# Patient Record
Sex: Female | Born: 1945 | Race: White | Hispanic: No | Marital: Married | Smoking: Current every day smoker
Health system: Southern US, Community
[De-identification: ages and names within clinical notes are randomized; demographics above are authoritative.]

## PROBLEM LIST (undated history)

## (undated) DIAGNOSIS — M1990 Unspecified osteoarthritis, unspecified site: Secondary | ICD-10-CM

## (undated) DIAGNOSIS — I10 Essential (primary) hypertension: Secondary | ICD-10-CM

## (undated) DIAGNOSIS — E079 Disorder of thyroid, unspecified: Secondary | ICD-10-CM

## (undated) DIAGNOSIS — A0472 Enterocolitis due to Clostridium difficile, not specified as recurrent: Secondary | ICD-10-CM

## (undated) HISTORY — PX: TONSILLECTOMY: SUR1361

## (undated) SURGERY — PULMONARY THROMBECTOMY
Anesthesia: Moderate Sedation | Laterality: Bilateral

---

## 2003-09-20 ENCOUNTER — Encounter: Admission: RE | Admit: 2003-09-20 | Discharge: 2003-09-20 | Payer: Self-pay | Admitting: Cardiology

## 2013-07-16 ENCOUNTER — Inpatient Hospital Stay: Payer: Self-pay | Admitting: Internal Medicine

## 2013-07-16 LAB — URINALYSIS, COMPLETE
Bilirubin,UR: NEGATIVE
Blood: NEGATIVE
Nitrite: NEGATIVE
Ph: 5 (ref 4.5–8.0)
RBC,UR: 1 /HPF (ref 0–5)
Specific Gravity: 1.025 (ref 1.003–1.030)
Squamous Epithelial: 3
WBC UR: 3 /HPF (ref 0–5)

## 2013-07-16 LAB — COMPREHENSIVE METABOLIC PANEL
Alkaline Phosphatase: 75 U/L (ref 50–136)
Calcium, Total: 8.9 mg/dL (ref 8.5–10.1)
Chloride: 111 mmol/L — ABNORMAL HIGH (ref 98–107)
Co2: 24 mmol/L (ref 21–32)
Creatinine: 1.2 mg/dL (ref 0.60–1.30)
EGFR (African American): 55 — ABNORMAL LOW
Potassium: 3.2 mmol/L — ABNORMAL LOW (ref 3.5–5.1)
SGOT(AST): 16 U/L (ref 15–37)
SGPT (ALT): 25 U/L (ref 12–78)
Sodium: 143 mmol/L (ref 136–145)

## 2013-07-16 LAB — CBC
HCT: 43.2 % (ref 35.0–47.0)
HGB: 14.9 g/dL (ref 12.0–16.0)
MCH: 31.7 pg (ref 26.0–34.0)
MCHC: 34.6 g/dL (ref 32.0–36.0)
MCV: 92 fL (ref 80–100)
RBC: 4.71 10*6/uL (ref 3.80–5.20)
RDW: 13.2 % (ref 11.5–14.5)
WBC: 9.6 10*3/uL (ref 3.6–11.0)

## 2013-07-16 LAB — LIPASE, BLOOD: Lipase: 67 U/L — ABNORMAL LOW (ref 73–393)

## 2013-07-17 LAB — COMPREHENSIVE METABOLIC PANEL
Alkaline Phosphatase: 55 U/L (ref 50–136)
Anion Gap: 5 — ABNORMAL LOW (ref 7–16)
Bilirubin,Total: 0.3 mg/dL (ref 0.2–1.0)
Calcium, Total: 8 mg/dL — ABNORMAL LOW (ref 8.5–10.1)
Chloride: 116 mmol/L — ABNORMAL HIGH (ref 98–107)
Glucose: 89 mg/dL (ref 65–99)
SGOT(AST): 11 U/L — ABNORMAL LOW (ref 15–37)
Total Protein: 5.3 g/dL — ABNORMAL LOW (ref 6.4–8.2)

## 2013-07-17 LAB — CBC WITH DIFFERENTIAL/PLATELET
Eosinophil %: 4.5 %
HGB: 13.2 g/dL (ref 12.0–16.0)
Lymphocyte #: 1.7 10*3/uL (ref 1.0–3.6)
MCH: 32.1 pg (ref 26.0–34.0)
MCHC: 34.8 g/dL (ref 32.0–36.0)
MCV: 92 fL (ref 80–100)
Monocyte #: 1.4 x10 3/mm — ABNORMAL HIGH (ref 0.2–0.9)
Monocyte %: 18.1 %
Neutrophil #: 4.1 10*3/uL (ref 1.4–6.5)
Platelet: 163 10*3/uL (ref 150–440)
WBC: 7.5 10*3/uL (ref 3.6–11.0)

## 2013-07-18 LAB — STOOL CULTURE

## 2015-03-08 NOTE — H&P (Signed)
PATIENT NAMEEASTER, [name] MR#:  [id number] DATE OF BIRTH:  1946/11/02  DATE OF ADMISSION:  07/16/2013  REFERRING PHYSICIAN:  Dr. Carollee Massed  FAMILY PHYSICIAN:  Non-local   REASON FOR ADMISSION:  Abdominal pain with diarrhea.   HISTORY OF PRESENT ILLNESS: The patient is a 69 year old female with a history of benign hypertension, hyperlipidemia, and Hashimoto's thyroiditis, who was admitted to a hospital in [place] approximately 3 to 4 weeks ago with pneumonia, UTI, and sepsis. Was discharged on antibiotics, which she finished approximately 2 weeks ago. Is visiting her children here in [place], and presents to the Emergency Room with a 2-week history of nausea, abdominal pain, and diarrhea. In the Emergency Room, the patient was found to be mildly hypotensive and hypokalemic. Stool returned positive for C. diff. CT confirmed colitis. She is now admitted for further evaluation.   PAST MEDICAL HISTORY: 1.  Recent pneumonia with sepsis.  2.  Recent UTI.   3.  Benign hypertension.  4.  Hyperlipidemia.  5.  History of Hashimoto's thyroiditis.   MEDICATIONS ON ADMISSION:  1.  Synthroid 125 mcg p.o. daily.  2.  Cytomel 5 mcg p.o. daily.  3.  Crestor 10 mg p.o. every other day.  4.  Lisinopril daily, dose unknown.   ALLERGIES:  No known drug allergies.   SOCIAL HISTORY: Negative for alcohol or tobacco abuse. She is married.   FAMILY HISTORY: Positive for hypertension and diabetes, but otherwise unremarkable.   REVIEW OF SYSTEMS:  CONSTITUTIONAL: Low-grade fever. No change in weight.  EYES: No blurred or double vision. No glaucoma.  ENT:  No tinnitus or hearing loss. No nasal discharge or bleeding. No difficulty swallowing.  RESPIRATORY: No cough or wheezing. Denies hemoptysis. No painful respiration.  CARDIOVASCULAR:  No chest pain or orthopnea. No palpitations or syncope.  GASTROINTESTINAL: No vomiting. Otherwise as per HPI.  GENITOURINARY: No dysuria or hematuria. No  incontinence.  ENDOCRINE: No polyuria or polydipsia. No heat or cold intolerance.  HEMATOLOGIC: The patient denies anemia, easy bruising, or bleeding.  LYMPHATIC: No swollen glands.  MUSCULOSKELETAL: The patient denies pain in her neck, back, shoulders, knees, hips. No gout.  NEUROLOGIC:  No numbness or migraines. Denies stroke or seizures. Does have generalized weakness.  PSYCHIATRIC: The patient denies anxiety, insomnia, or depression.   PHYSICAL EXAMINATION: GENERAL: The patient is well-developed, well-nourished, in no acute distress.  VITAL SIGNS:  Remarkable for blood pressure 108/73, with a heart rate of 98, respiratory rate of 20, she is afebrile.  HEENT: Normocephalic, atraumatic. Pupils equally round and reactive to light and accommodation. Extraocular movements are intact. Sclerae are not icteric. Conjunctivae are clear. Oropharynx is clear. NECK:  Supple, without JVD or bruits. No adenopathy or thyromegaly is noted.  LUNGS:  Reveal basilar rhonchi, without wheezes or rales. No dullness. Respiratory effort is normal.  CARDIAC EXAM: Regular rate and rhythm, with a normal S1, S2. No significant rubs, murmurs, or gallops. PMI is nondisplaced. Chest wall is nontender.  ABDOMEN: Soft, but tender in the left lower quadrant. No rebound or guarding. Normoactive bowel sounds. No organomegaly or masses were appreciated. No hernias or bruits were noted.  EXTREMITIES: Without clubbing, cyanosis, edema. Pulses were 2+ bilaterally.  SKIN: Warm and dry, without rash or lesions.  NEUROLOGIC EXAM:  Revealed cranial nerves II through XII grossly intact. Deep tendon reflexes were symmetric. Motor and sensory exams nonfocal.  PSYCHIATRIC: Exam revealed a patient who is alert and oriented to person, place and time. She was cooperative  and used good judgment.   LABORATORY DATA:  Glucose was 115, with a BUN of 14, creatinine 1.20, with a GFR of 47, a potassium of 3.2, with a sodium of 143. Lipase was 67.  White count was 9.6, with a hemoglobin of 14.9. Stool for C. diff was positive. Urinalysis revealed no bacteria, with only 3 WBCs per high-power field. CT of the abdomen revealed colitis diffusely.   ASSESSMENT: 1.  Clostridium difficile colitis.  2.  Abdominal pain with diarrhea.  3.  Hypokalemia.  4.  Dehydration, with mild renal insufficiency.  5.  Hypothyroidism.  6.  Benign hypertension, stable off medication.   PLAN: The patient will be admitted to the floor and started on IV fluids with potassium supplementation and IV Flagyl and p.o. vancomycin. Will obtain a chest x-ray, given her recent history of pneumonia. Urinalysis looks good. Will continue her thyroid medications and check a thyroid panel in the morning. Consult GI in the morning. Nothing by mouth, except ice chips and medications for now. Further treatment and evaluation will depend upon the patient's progress.   Total time spent on this patient was 50 minutes.    ____________________________ Duane LopeJeffrey D. Judithann SheenSparks, MD jds:mr D: 07/16/2013 18:15:41 ET T: 07/16/2013 21:07:40 ET JOB#: [id number]  cc: Duane LopeJeffrey D. Judithann SheenSparks, MD, <Dictator> Daivik Overley Rodena Medin Yuritzi Kamp MD ELECTRONICALLY SIGNED 07/17/2013 12:39

## 2015-03-08 NOTE — Consult Note (Signed)
Pt seen and examined. Full consult to follow. Recently treated for penumonia/UT[place]. Started having nausea/abd pain/diarrhea. Given more cipro by primary MD, which made sxs worse. Pt stopped cipro on own when she felt worse. Pt then given kaopectate which did not work. Decided to come to ER [place] and found to have c.diff. Started on vanco/flagyl last night. Starting to feel better. Pt never had screening colonoscopy. Continue vanco. Since this is her 1st bout of c.diff, expect patient to recover with 10 day course of Abx. Pt will eventually need colonoscopy in [place] once patient is discharged. I will be out tomorrow but will check back on Wed if patient still here. Call GI on call tomorrow if there are GI concerns. Thanks.   Electronic Signatures: Lutricia Feilh, Taison Celani (MD) (Signed on 01-Sep-14 11:47)  Authored   Last Updated: 01-Sep-14 13:26 by Lutricia Feilh, Niralya Ohanian (MD)

## 2015-03-08 NOTE — Discharge Summary (Signed)
PATIENT NAMHerby Decker:  Decker, Sarah MR#:  [id number] DATE OF BIRTH:  12-Dec-1945  DATE OF ADMISSION:  07/16/2013 DATE OF DISCHARGE:  07/19/2013  PRIMARY CARE PHYSICIAN: In [place].  DISCHARGE DIAGNOSES: 1.  Clostridium difficile colitis.  2.  Dehydration.  3.  Hypokalemia.   IMAGING STUDIES DONE: Included a CT scan of the abdomen which showed diffuse colitis.   CONSULTATIONS: Dr. Bluford Kaufmannh of GI.   ADMITTING HISTORY AND PHYSICAL AND HOSPITAL COURSE: Please see detailed  H and P dictated previously. A 69 year old female patient with recent antibiotic use presented to the hospital with diarrhea. C. diff was checked, which was positive. CT scan showing colitis, and the patient was started on vancomycin, Flagyl, admitted to the hospitalist service with IV fluids for dehydration.   The patient improved well. By the day of discharge she had only 3 bowel movements, felt some gurgling in her abdomen, but no abdominal pain. Abdominal examination shows minimal tenderness in the lower abdomen. Was seen by Dr. Bluford Kaufmannh prior to discharge. Vitals in the stable range, and patient is being discharged back home to follow up with her primary care physician in [place] for C. diff colitis. She will be on a total of 10 days of vancomycin and Flagyl orally.   DISCHARGE MEDICATIONS: Include:  1.  Flagyl 500 mg oral 3 times a day for 7 days.  2.  Vancomycin 250 mg oral every 6 hours for 7 days.  3.  Cytomel 5 mcg oral once a day.  4.  Synthroid 125 mcg oral once a day.  5.  Tramadol 50 mg oral every 6 hours as needed for pain.   DISCHARGE INSTRUCTIONS: Regular diet.   ACTIVITY: As tolerated.   Follow up with primary care physician in [place].   Time spent on day of discharge in discharge activity was forty-five minutes.      ____________________________ Molinda BailiffSrikar R. Ganon Demasi, MD srs:dm D: 07/19/2013 15:05:42 ET T: 07/19/2013 15:50:00 ET JOB#: [id number]  cc: Wardell HeathSrikar R. Liane Tribbey, MD, <Dictator> Orie FishermanSRIKAR R Shanele Nissan  MD ELECTRONICALLY SIGNED 07/20/2013 8:07

## 2015-03-08 NOTE — Consult Note (Signed)
PATIENT NAMHerby Abraham:  Montesano, Franziska MR#:  [id number] DATE OF BIRTH:  May 25, 1946  DATE OF CONSULTATION:  07/17/2013  REFERRING PHYSICIAN:   CONSULTING PHYSICIAN:  Ezzard StandingPaul Y. Arrow Tomko, MD  REASON FOR REFERRAL: C. difficile colitis.   HISTORY OF PRESENT ILLNESS: The patient is a 69 year old white female with a known history of hypertension, hyperlipidemia and thyroiditis. She was admitted to a hospital in [place] where she normally lives several weeks ago with a bout of pneumonia and urinary tract infection. She was discharged to home on antibiotics, including ciprofloxacin. After several days of ciprofloxacin, she started to develop increasing abdominal pain, nausea and diarrhea. She followed up with her primary doctor who told her to take Cipro for another week or so. Unfortunately, her symptoms got worse to the point that she had to stop the Cipro on her own after several days. She followed with her primary again. This time she was given Kaopectate to take. Unfortunately Kaopectate did not make things any better but worse.   [place] then came [place] to [place] and decided to come to the Emergency Room yesterday because her symptoms were just not getting any better. She was supposed to return to [place] but felt that she just could not make it on her own. In the Emergency Room, she was found to be hypotensive and hyperkalemic. Stool was sent. It came back positive for C. diff. Even though she is [age], she has never had a screening colonoscopy. She is starting to feel better already with vancomycin and Flagyl.   PAST MEDICAL HISTORY: Notable for recent pneumonia and UTI.  Other history includes hypertension, hyperlipidemia and thyroiditis.   HOME MEDICATIONS: Include lisinopril daily, Crestor 10 mg every other day, Synthroid and Cytomel.   ALLERGIES: No known drug allergies.   SOCIAL HISTORY: She denies any tobacco or alcohol use.   FAMILY HISTORY: Notable for hypertension and diabetes.    REVIEW OF SYSTEMS: There is some low-grade fever, but no chills and no weight changes. There is no visual or hearing change. There is no coughing or shortness of breath. There is no chest pain or palpitations. GI history has been described already. There is nausea but no vomiting. There is no gross hematochezia or melena. The rest of the review of systems are negative.   PHYSICAL EXAMINATION: GENERAL: The patient appears to be in no acute distress right now.  VITAL SIGNS: She is afebrile. Her blood pressure is stable. The rest of the vital signs are stable. HEAD AND NECK: Within normal limits.  HEART: Regular rhythm and rate.  LUNGS: Clear bilaterally.  ABDOMEN: Normoactive bowel sounds, soft. There is some mild tenderness in the lower abdomen, particularly in the left side. There is no rebound or guarding.  EXTREMITIES: No clubbing, cyanosis or edema.  SKIN: Negative.  NEUROLOGIC: Nonfocal.   LABORATORY DATA: Today sodium is 146 and potassium 3.5; it was 3.2 yesterday. Chloride 116, BUN 11, creatinine 1.02 and glucose 89. Liver enzymes are normal. White count 7.5 and hemoglobin 13.2. C. diff was positive. Urinalysis is negative.   ASSESSMENT AND PLAN: This is a patient with recent antibiotic use that led to C. diff colitis. It was exacerbated by persistent Cipro and Kaopectate use. These were stopped. The patient is now on vancomycin and Flagyl. She should respond relatively quickly since this is her first bout. She can go home on these medicines after she is discharged. Even after she is discharged she should continue with those  antibiotics orally. She will eventually need a colonoscopy in [place] once the patient is discharged. I will be out tomorrow, but I will check back on her on Wednesday if the patient is still here. Thank you for the referral. ____________________________ Ezzard Standing. Bluford Kaufmann, MD pyo:sb D: 07/17/2013 13:26:14 ET T: 07/17/2013 13:50:44 ET JOB#: [id number]  cc: Ezzard Standing. Bluford Kaufmann, MD,  <Dictator> Ezzard Standing Phil Michels MD ELECTRONICALLY SIGNED 07/19/2013 8:37

## 2015-03-08 NOTE — Consult Note (Signed)
Chief Complaint:  Subjective/Chief Complaint Feeling better. Less abd pain. Abd still feels uneasy. No bleeding. 3 Bm's this AM. Loose. Wants to go home to [facility] by tomorrow.   VITAL SIGNS/ANCILLARY NOTES: **Vital Signs.:   03-Sep-14 09:50  Vital Signs Type Q 4hr  Temperature Temperature (F) 98.2  Celsius 36.7  Pulse Pulse 64  Respirations Respirations 20  Systolic BP Systolic BP 409  Diastolic BP (mmHg) Diastolic BP (mmHg) 74  Mean BP 121  Pulse Ox % Pulse Ox % 95  Pulse Ox Activity Level  At rest  Oxygen Delivery Room Air/ 21 %   Brief Assessment:  GEN no acute distress   Cardiac Regular   Respiratory clear BS   Gastrointestinal min abd tenderness   Lab Results: Hepatic:  01-Sep-14 04:13   Bilirubin, Total 0.3  Alkaline Phosphatase 55  SGPT (ALT) 18  SGOT (AST)  11  Total Protein, Serum  5.3  Albumin, Serum  2.4  Routine Chem:  01-Sep-14 04:13   Glucose, Serum 89  BUN 11  Creatinine (comp) 1.02  Sodium, Serum  146  Potassium, Serum 3.5  Chloride, Serum  116  CO2, Serum 25  Calcium (Total), Serum  8.0  Osmolality (calc) 289  eGFR (African American) >60  eGFR (Non-African American)  58 (eGFR values <78m/min/1.73 m2 may be an indication of chronic kidney disease (CKD). Calculated eGFR is useful in patients with stable renal function. The eGFR calculation will not be reliable in acutely ill patients when serum creatinine is changing rapidly. It is not useful in  patients on dialysis. The eGFR calculation may not be applicable to patients at the low and high extremes of body sizes, pregnant women, and vegetarians.)  Anion Gap  5  Routine Hem:  01-Sep-14 04:13   WBC (CBC) 7.5  RBC (CBC) 4.11  Hemoglobin (CBC) 13.2  Hematocrit (CBC) 37.9  Platelet Count (CBC) 163  MCV 92  MCH 32.1  MCHC 34.8  RDW 13.1  Neutrophil % 54.9  Lymphocyte % 22.2  Monocyte % 18.1  Eosinophil % 4.5  Basophil % 0.3  Neutrophil # 4.1  Lymphocyte # 1.7  Monocyte #  1.4   Eosinophil # 0.3  Basophil # 0.0 (Result(s) reported on 17 Jul 2013 at 04:52AM.)   Assessment/Plan:  Assessment/Plan:  Assessment Diverticulitis. Doing better.   Plan Diet to be advanced to solids. Possible discharge later today. Continue Abx for c.diff. Should try to have scree[place] Will sign off. Thanks   Electronic Signatures: OVerdie Shire(MD)  (Signed 03-Sep-14 12:17)  Authored: Chief Complaint, VITAL SIGNS/ANCILLARY NOTES, Brief Assessment, Lab Results, Assessment/Plan   Last Updated: 03-Sep-14 12:17 by OVerdie Shire(MD)

## 2016-10-13 DIAGNOSIS — I10 Essential (primary) hypertension: Secondary | ICD-10-CM | POA: Diagnosis present

## 2017-01-15 DIAGNOSIS — E039 Hypothyroidism, unspecified: Secondary | ICD-10-CM | POA: Diagnosis present

## 2017-04-02 ENCOUNTER — Encounter: Payer: Self-pay | Admitting: Emergency Medicine

## 2017-04-02 ENCOUNTER — Emergency Department
Admission: EM | Admit: 2017-04-02 | Discharge: 2017-04-02 | Disposition: A | Discharge status: Home / Self Care | Payer: No Typology Code available for payment source | Attending: Emergency Medicine | Admitting: Emergency Medicine

## 2017-04-02 ENCOUNTER — Emergency Department: Payer: No Typology Code available for payment source

## 2017-04-02 DIAGNOSIS — Y92410 Unspecified street and highway as the place of occurrence of the external cause: Secondary | ICD-10-CM | POA: Insufficient documentation

## 2017-04-02 DIAGNOSIS — S8002XA Contusion of left knee, initial encounter: Secondary | ICD-10-CM | POA: Diagnosis not present

## 2017-04-02 DIAGNOSIS — F17200 Nicotine dependence, unspecified, uncomplicated: Secondary | ICD-10-CM | POA: Insufficient documentation

## 2017-04-02 DIAGNOSIS — S60222A Contusion of left hand, initial encounter: Secondary | ICD-10-CM | POA: Diagnosis not present

## 2017-04-02 DIAGNOSIS — I10 Essential (primary) hypertension: Secondary | ICD-10-CM | POA: Insufficient documentation

## 2017-04-02 DIAGNOSIS — S40022A Contusion of left upper arm, initial encounter: Secondary | ICD-10-CM

## 2017-04-02 DIAGNOSIS — S00512A Abrasion of oral cavity, initial encounter: Secondary | ICD-10-CM

## 2017-04-02 DIAGNOSIS — S59912A Unspecified injury of left forearm, initial encounter: Secondary | ICD-10-CM | POA: Diagnosis present

## 2017-04-02 DIAGNOSIS — S5012XA Contusion of left forearm, initial encounter: Secondary | ICD-10-CM | POA: Insufficient documentation

## 2017-04-02 DIAGNOSIS — Y999 Unspecified external cause status: Secondary | ICD-10-CM | POA: Diagnosis not present

## 2017-04-02 DIAGNOSIS — Y939 Activity, unspecified: Secondary | ICD-10-CM | POA: Diagnosis not present

## 2017-04-02 HISTORY — DX: Disorder of thyroid, unspecified: E07.9

## 2017-04-02 HISTORY — DX: Essential (primary) hypertension: I10

## 2017-04-02 HISTORY — DX: Enterocolitis due to Clostridium difficile, not specified as recurrent: A04.72

## 2017-04-02 HISTORY — DX: Unspecified osteoarthritis, unspecified site: M19.90

## 2017-04-02 NOTE — Discharge Instructions (Signed)
Continue taking the tramadol that you have at home for pain. Ice to your left hand as needed for pain and swelling. You may also use this for your knee and your arm. Follow-up with your primary care provider if any continued problems with no clinic.

## 2017-04-02 NOTE — ED Notes (Signed)
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
## 2018-09-28 DIAGNOSIS — L400 Psoriasis vulgaris: Secondary | ICD-10-CM | POA: Diagnosis present

## 2019-08-18 ENCOUNTER — Other Ambulatory Visit: Payer: Self-pay | Admitting: Neurosurgery

## 2019-08-18 ENCOUNTER — Other Ambulatory Visit (HOSPITAL_COMMUNITY): Payer: Self-pay | Admitting: Neurosurgery

## 2019-08-18 DIAGNOSIS — M4316 Spondylolisthesis, lumbar region: Secondary | ICD-10-CM

## 2023-08-05 ENCOUNTER — Emergency Department: Payer: Medicare Other

## 2023-08-05 ENCOUNTER — Other Ambulatory Visit: Payer: Self-pay

## 2023-08-05 ENCOUNTER — Emergency Department
Admission: EM | Admit: 2023-08-05 | Discharge: 2023-08-05 | Disposition: A | Discharge status: Home / Self Care | Payer: Medicare Other | Attending: Emergency Medicine | Admitting: Emergency Medicine

## 2023-08-05 DIAGNOSIS — R112 Nausea with vomiting, unspecified: Secondary | ICD-10-CM | POA: Diagnosis not present

## 2023-08-05 DIAGNOSIS — I10 Essential (primary) hypertension: Secondary | ICD-10-CM | POA: Insufficient documentation

## 2023-08-05 DIAGNOSIS — R2689 Other abnormalities of gait and mobility: Secondary | ICD-10-CM

## 2023-08-05 DIAGNOSIS — Y92094 Garage of other non-institutional residence as the place of occurrence of the external cause: Secondary | ICD-10-CM | POA: Diagnosis not present

## 2023-08-05 DIAGNOSIS — R269 Unspecified abnormalities of gait and mobility: Secondary | ICD-10-CM | POA: Insufficient documentation

## 2023-08-05 DIAGNOSIS — R42 Dizziness and giddiness: Secondary | ICD-10-CM | POA: Insufficient documentation

## 2023-08-05 DIAGNOSIS — W19XXXA Unspecified fall, initial encounter: Secondary | ICD-10-CM | POA: Insufficient documentation

## 2023-08-05 LAB — CBC
HCT: 44.2 % (ref 36.0–46.0)
Hemoglobin: 14.8 g/dL (ref 12.0–15.0)
MCH: 28.7 pg (ref 26.0–34.0)
MCHC: 33.5 g/dL (ref 30.0–36.0)
MCV: 85.8 fL (ref 80.0–100.0)
Platelets: 414 10*3/uL — ABNORMAL HIGH (ref 150–400)
RBC: 5.15 MIL/uL — ABNORMAL HIGH (ref 3.87–5.11)
RDW: 13.2 % (ref 11.5–15.5)
WBC: 10.9 10*3/uL — ABNORMAL HIGH (ref 4.0–10.5)
nRBC: 0 % (ref 0.0–0.2)

## 2023-08-05 LAB — MAGNESIUM: Magnesium: 1.9 mg/dL (ref 1.7–2.4)

## 2023-08-05 LAB — CK: Total CK: 67 U/L (ref 38–234)

## 2023-08-05 LAB — HEPATIC FUNCTION PANEL
ALT: 9 U/L (ref 0–44)
AST: 14 U/L — ABNORMAL LOW (ref 15–41)
Albumin: 3.1 g/dL — ABNORMAL LOW (ref 3.5–5.0)
Alkaline Phosphatase: 87 U/L (ref 38–126)
Bilirubin, Direct: 0.2 mg/dL (ref 0.0–0.2)
Indirect Bilirubin: 0.6 mg/dL (ref 0.3–0.9)
Total Bilirubin: 0.8 mg/dL (ref 0.3–1.2)
Total Protein: 6.6 g/dL (ref 6.5–8.1)

## 2023-08-05 LAB — BASIC METABOLIC PANEL
Anion gap: 9 (ref 5–15)
BUN: 16 mg/dL (ref 8–23)
CO2: 26 mmol/L (ref 22–32)
Calcium: 9.3 mg/dL (ref 8.9–10.3)
Chloride: 100 mmol/L (ref 98–111)
Creatinine, Ser: 1.05 mg/dL — ABNORMAL HIGH (ref 0.44–1.00)
GFR, Estimated: 55 mL/min — ABNORMAL LOW (ref >60)
Glucose, Bld: 113 mg/dL — ABNORMAL HIGH (ref 70–99)
Potassium: 4.1 mmol/L (ref 3.5–5.1)
Sodium: 135 mmol/L (ref 135–145)

## 2023-08-05 LAB — T4, FREE: Free T4: 0.42 ng/dL — ABNORMAL LOW (ref 0.61–1.12)

## 2023-08-05 LAB — TROPONIN I (HIGH SENSITIVITY): Troponin I (High Sensitivity): 7 ng/L (ref <18)

## 2023-08-05 LAB — TSH: TSH: 29.82 u[IU]/mL — ABNORMAL HIGH (ref 0.350–4.500)

## 2023-08-05 MED ORDER — IOHEXOL 350 MG/ML SOLN
75.0000 mL | Freq: Once | INTRAVENOUS | Status: AC | PRN
  Administered 2023-08-05: 75 mL via INTRAVENOUS

## 2023-08-05 MED ORDER — MECLIZINE HCL 25 MG PO TABS
25.0000 mg | ORAL_TABLET | Freq: Once | ORAL | Status: AC
  Administered 2023-08-05: 25 mg via ORAL
  Filled 2023-08-05: qty 1

## 2023-08-05 MED ORDER — SODIUM CHLORIDE 0.9 % IV BOLUS
1000.0000 mL | Freq: Once | INTRAVENOUS | Status: AC
  Administered 2023-08-05: 1000 mL via INTRAVENOUS

## 2023-08-05 NOTE — ED Notes (Signed)
Provided pt with discharge instructions and education. All of pt questions answered. Pt in possession of all belongings. Pt AAOX4 and stable at time of discharge. Pt wheeled to ED exit, family member with pt for tx home.

## 2023-08-05 NOTE — Discharge Instructions (Signed)
Make sure to take your thyroid medication since you missed the last few doses your thyroid levels have decreased slightly.  Thank you for choosing [place] for your health care today!  Please see your primary doctor this week for a follow up appointment.   If you have any new, worsening, or unexpected symptoms call your doctor right away or come back to the emergency department for reevaluation.  It was my pleasure to care for you today.   Daneil Dan Modesto Charon, MD

## 2023-08-05 NOTE — ED Provider Notes (Addendum)
[facility] Provider Note    Event Date/Time   First MD Initiated Contact with Patient 08/05/23 1506     (approximate)   History   Fall and Weakness   HPI  [name] is a 77 y.o. female   Past medical history of arthritis affecting both knees, hypertension and thyroid disease who presents to the emergency department with mechanical slip and fall in the garage approximately 1 week ago.  The fall was preceded the night before with a bout of nausea and vomiting that she attributed to eating [place] food.  She was walking to feed her cats leaned over onto a table which rolled out from underneath her and she fell.  She did not strike her head or lose consciousness or sustain any major injuries.  She states that she blew up an air mattress and lay down in the garage all week for fear of getting up and reinjuring herself.    She states that her family members brought her food and helped her change diapers while laying on the air mattress in the bed.    She came to the hospital today because she has a doctor's appointment and decided finally to get checked out.    She does not take blood thinners.  She denies any recent illnesses leading up to the fall or presyncopal symptoms.    Independent Historian contributed to assessment above: Husband is at bedside to corroborate information past medical history as above     Physical Exam   Triage Vital Signs: ED Triage Vitals  Encounter Vitals Group     BP 08/05/23 1248 138/76     Systolic BP Percentile --      Diastolic BP Percentile --      Pulse Rate 08/05/23 1248 96     Resp 08/05/23 1248 18     Temp 08/05/23 1249 98.4 F (36.9 C)     Temp src --      SpO2 08/05/23 1248 100 %     Weight 08/05/23 1249 185 lb (83.9 kg)     Height 08/05/23 1249 5\' 6"  (1.676 m)     Head Circumference --      Peak Flow --      Pain Score 08/05/23 1248 0     Pain Loc --      Pain Education --      Exclude from Growth Chart  --     Most recent vital signs: Vitals:   08/05/23 1249 08/05/23 1545  BP:  (!) 113/98  Pulse:  88  Resp:    Temp: 98.4 F (36.9 C)   SpO2:  98%    General: Awake, no distress.  CV:  Good peripheral perfusion.  Resp:  Normal effort.  Abd:  No distention.  Other:  Awake alert comfortable appearing in no acute distress.  Normal vital signs.  Appears euvolemic.  Neurologic exam normal findings including dysarthria facial asymmetry motor or sensory deficits normal finger-to-nose, except that when she stands she immediately begins to sway and fall backwards into the bed.  She does not attribute this to knee pain but instead a sense of imbalance.  Her knees appear atraumatic she is able to range, there is no swelling or warmth or erythema   ED Results / Procedures / Treatments   Labs (all labs ordered are listed, but only abnormal results are displayed) Labs Reviewed  CBC - Abnormal; Notable for the following components:  Result Value   WBC 10.9 (*)    RBC 5.15 (*)    Platelets 414 (*)    All other components within normal limits  BASIC METABOLIC PANEL - Abnormal; Notable for the following components:   Glucose, Bld 113 (*)    Creatinine, Ser 1.05 (*)    GFR, Estimated 55 (*)    All other components within normal limits  HEPATIC FUNCTION PANEL - Abnormal; Notable for the following components:   Albumin 3.1 (*)    AST 14 (*)    All other components within normal limits  TSH - Abnormal; Notable for the following components:   TSH 29.820 (*)    All other components within normal limits  T4, FREE - Abnormal; Notable for the following components:   Free T4 0.42 (*)    All other components within normal limits  CK  MAGNESIUM  URINALYSIS, W/ REFLEX TO CULTURE (INFECTION SUSPECTED)  TROPONIN I (HIGH SENSITIVITY)  TROPONIN I (HIGH SENSITIVITY)     I ordered and reviewed the above labs they are notable for cell counts and electrolytes largely unremarkable.  EKG  ED ECG  REPORT I, Pilar Jarvis, the attending physician, personally viewed and interpreted this ECG.   Date: 08/05/2023  EKG Time: 1250  Rate: 95  Rhythm: sinus w frequent PVC, bigeminY?   Axis: nl  Intervals:none  ST&T Change: no stemi    RADIOLOGY I independently reviewed and interpreted the ct scan of the head see no obvious bleeding or midline shift I also reviewed radiologist's formal read.   PROCEDURES:  Critical Care performed: No  Procedures   MEDICATIONS ORDERED IN ED: Medications  sodium chloride 0.9 % bolus 1,000 mL (1,000 mLs Intravenous New Bag/Given 08/05/23 1555)  meclizine (ANTIVERT) tablet 25 mg (25 mg Oral Given 08/05/23 1555)  iohexol (OMNIPAQUE) 350 MG/ML injection 75 mL (75 mLs Intravenous Contrast Given 08/05/23 1607)     IMPRESSION / MDM / ASSESSMENT AND PLAN / ED COURSE  I reviewed the triage vital signs and the nursing notes.                                Patient's presentation is most consistent with acute presentation with potential threat to life or bodily function.  Differential diagnosis includes, but is not limited to, dehydration, electrolyte disturbance, stroke, ICH, peripheral versus central vertigo, instability due to chronic arthritis of bilateral knee   The patient is on the cardiac monitor to evaluate for evidence of arrhythmia and/or significant heart rate changes.  MDM:    Unusual story this patient with what seems according to her to be a mechanical slip and fall 1 week ago.  She also states that she was unable to get up from the bed more out of an unwillingness and fear of reinjuring her knee is if she were to fall again but did not describe any imbalance or signs of dizziness or vertigo during the week that she laid on her mattress in the garage.  She also denies any chest pain, shortness of breath, respiratory infectious symptoms, GI or GU complaints during this 1 week as well.  Her exam is largely benign when she is laying in bed with  no focal neurologic deficits obtained, soft nontender abdomen, clear lungs, and sharp mentation completely oriented but upon standing she immediately falls back into the bed and denies that this is due to knee pain.  Instead she complains of  a sense of imbalance immediately upon standing.  She states that this is the first time she tried to stand since her fall last week.  I am concerned for stroke.  She is well outside the thrombolytic window and no signs of LVO, 1 week out, we will proceed with CT angiogram and MRI of the brain, as well as lab tests, troponins given unusual appearing EKG without STEMI but with frequent PVCs versus bigeminy.  Check electrolytes.  Fluids and meclizine.   -- Workup unremarkable and patient stable.  Ambulatory after some fluids and meclizine.  Her thyroid levels were slightly low but she has missed her thyroid medicine over the last several days, advised to take and follow-up with PMD. DC      FINAL CLINICAL IMPRESSION(S) / ED DIAGNOSES   Final diagnoses:  Fall, initial encounter  Imbalance     Rx / DC Orders   ED Discharge Orders     None        Note:  This document was prepared using Dragon voice recognition software and may include unintentional dictation errors.    Pilar Jarvis, MD 08/05/23 1646    Pilar Jarvis, MD 08/05/23 [phone]

## 2023-08-05 NOTE — ED Triage Notes (Signed)
Pt to ED [facility] from home for fall a week ago with n/v. Generalized weakness. States laid on air mattress for one week in garage after fall d/t dizziness with standing. Husband brought food and water to her. NAD noted.  Denies hitting head with fall, denies complaints.

## 2023-10-12 ENCOUNTER — Other Ambulatory Visit: Payer: Self-pay | Admitting: Internal Medicine

## 2023-10-12 DIAGNOSIS — R55 Syncope and collapse: Secondary | ICD-10-CM

## 2023-10-12 DIAGNOSIS — G450 Vertebro-basilar artery syndrome: Secondary | ICD-10-CM

## 2023-10-29 ENCOUNTER — Ambulatory Visit
Admission: RE | Admit: 2023-10-29 | Discharge: 2023-10-29 | Disposition: A | Discharge status: Home / Self Care | Payer: Medicare Other | Source: Ambulatory Visit | Attending: Internal Medicine | Admitting: Internal Medicine

## 2023-10-29 DIAGNOSIS — G450 Vertebro-basilar artery syndrome: Secondary | ICD-10-CM | POA: Diagnosis present

## 2023-10-29 DIAGNOSIS — R55 Syncope and collapse: Secondary | ICD-10-CM | POA: Diagnosis present

## 2023-10-29 MED ORDER — IOHEXOL 350 MG/ML SOLN
75.0000 mL | Freq: Once | INTRAVENOUS | Status: AC | PRN
  Administered 2023-10-29: 75 mL via INTRAVENOUS

## 2023-11-04 ENCOUNTER — Encounter: Payer: Self-pay | Admitting: Cardiovascular Disease

## 2023-11-04 ENCOUNTER — Ambulatory Visit: Payer: Medicare Other | Attending: Cardiovascular Disease | Admitting: Cardiovascular Disease

## 2023-11-04 VITALS — BP 138/78 | HR 83

## 2023-11-04 DIAGNOSIS — Z720 Tobacco use: Secondary | ICD-10-CM | POA: Diagnosis not present

## 2023-11-04 DIAGNOSIS — E785 Hyperlipidemia, unspecified: Secondary | ICD-10-CM | POA: Diagnosis not present

## 2023-11-04 DIAGNOSIS — R0609 Other forms of dyspnea: Secondary | ICD-10-CM | POA: Diagnosis not present

## 2023-11-04 DIAGNOSIS — I4729 Other ventricular tachycardia: Secondary | ICD-10-CM | POA: Diagnosis not present

## 2023-11-04 DIAGNOSIS — I471 Supraventricular tachycardia, unspecified: Secondary | ICD-10-CM

## 2023-11-04 MED ORDER — METOPROLOL TARTRATE 100 MG PO TABS: ORAL_TABLET | ORAL | 0 refills | Status: DC

## 2023-11-04 NOTE — Progress Notes (Signed)
Cardiology Office Note   Date:  11/04/2023   ID:  [name], DOB September 05, 1946, MRN [id number]  PCP:  Danella Penton, MD  Cardiologist:   Lorine Bears, MD   Chief Complaint  Patient presents with   New Patient (Initial Visit)    Ref by Dr. Bethann Punches, MD for Non-SVT. Medications reviewed by the verbally.       History of Present Illness: [name] is a 77 y.o. female who was referred by Dr. Hyacinth Meeker for evaluation nonsustained ventricular tachycardia and presyncope. She has known history of hyperlipidemia, severe osteoarthritis of both knees, hypothyroidism on replacement and tobacco use.  She smokes 1 pack/day.  She is not aware of any previous cardiac history.  She does not know her family history given that she was adopted. She recently started having symptoms of vertigo described as spinning sensation especially with certain head movements.  In addition, she had increased palpitations with associated dizziness.  She had some episodes of loss of consciousness where she felt cold and sweaty before passing out.  She had brain imaging done in September including CT of the head which was unremarkable.  MRI brain was also unremarkable. 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 Her mobility is very limited by severe arthritis and she does complain of exertional dyspnea with no chest pain. She had a Holter monitor done with her primary care physician which showed PVCs and short runs of nonsustained ventricular tachycardia. She has underlying left bundle branch block.    Past Medical History:  Diagnosis Date   Arthritis    C. difficile diarrhea    Hypertension    Thyroid disease     Past Surgical History:  Procedure Laterality Date   TONSILLECTOMY       Current Outpatient Medications  Medication Sig Dispense Refill   atorvastatin (LIPITOR) 10 MG tablet Take 10 mg by mouth daily.     cyanocobalamin (VITAMIN B12) 1000  MCG/ML injection Inject 100 mcg into the skin every 30 (thirty) days.     escitalopram (LEXAPRO) 10 MG tablet Take 10 mg by mouth daily.     levothyroxine (SYNTHROID) 200 MCG tablet Take 200 mcg by mouth daily.     loratadine (CLARITIN) 10 MG tablet Take 10 mg by mouth daily as needed.     meclizine (ANTIVERT) 25 MG tablet Take 25 mg by mouth 2 (two) times daily as needed.     Melatonin 5 MG CAPS Take 5 mg by mouth at bedtime.     metoprolol tartrate (LOPRESSOR) 100 MG tablet Take one tablet two hours prior to the test 1 tablet 0   traMADol (ULTRAM) 50 MG tablet Take 50 mg by mouth every 6 (six) hours as needed.     No current facility-administered medications for this visit.    Allergies:   Ciprofloxacin    Social History:  The patient  reports that she has been smoking cigarettes. She has a 20 pack-year smoking history. She has never used smokeless tobacco. She reports that she does not drink alcohol and does not use drugs.   Family History:  The patient's She was adopted. Family history is unknown by patient.    ROS:  Please see the history of present illness.   Otherwise, review of systems are positive for none.   All other systems are reviewed and negative.    PHYSICAL EXAM: VS:  BP 138/78 (BP Location:  Right Arm, Patient Position: Sitting)   Pulse 83   SpO2 96%  , BMI There is no height or weight on file to calculate BMI. GEN: Well nourished, well developed, in no acute distress  HEENT: normal  Neck: no JVD, carotid bruits, or masses Cardiac: RRR; no murmurs, rubs, or gallops,no edema  Respiratory:  clear to auscultation bilaterally, normal work of breathing GI: soft, nontender, nondistended, + BS MS: no deformity or atrophy  Skin: warm and dry, no rash Neuro:  Strength and sensation are intact Psych: euthymic mood, full affect   EKG:  EKG is ordered today. The ekg ordered today demonstrates : Sinus rhythm with Fusion complexes Left bundle branch block When compared  with ECG of 05-Aug-2023 12:50, No significant change was found    Recent Labs: 08/05/2023: ALT 9; BUN 16; Creatinine, Ser 1.05; Hemoglobin 14.8; Magnesium 1.9; Platelets 414; Potassium 4.1; Sodium 135; TSH 29.820    Lipid Panel No results found for: "CHOL", "TRIG", "HDL", "CHOLHDL", "VLDL", "LDLCALC", "LDLDIRECT"    Wt Readings from Last 3 Encounters:  08/05/23 185 lb (83.9 kg)  04/02/17 240 lb (108.9 kg)          11/04/2023   10:55 AM  PAD Screen  Previous PAD dx? No  Previous surgical procedure? No  Pain with walking? No  Feet/toe relief with dangling? No  Painful, non-healing ulcers? No  Extremities discolored? No      ASSESSMENT AND PLAN:  1.  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  In addition, she has underlying left bundle branch block.  We have to evaluate for possible ischemic heart disease or underlying cardiomyopathy. I requested cardiac CTA for evaluation. In addition, will obtain an echocardiogram to evaluate LV systolic function. I request that the recent results of Holter monitor to see the burden of her PVCs.  2.  Hypothyroidism: On high-dose levothyroxine with recent labs showing very suppressed TSH at 0.024.  This might be contributing to some of her PVCs.  I asked her to discuss with her primary care physician regarding decreasing the dose of levothyroxine if possible.  3.  Tobacco use: I discussed the importance of smoking cessation.  4.  Hyperlipidemia: Currently on atorvastatin.    Disposition:   FU after cardiac testing.  Signed,  Lorine Bears, MD  11/04/2023 2:07 PM    [facility]

## 2023-11-04 NOTE — Patient Instructions (Addendum)
Medication Instructions:  No changes *If you need a refill on your cardiac medications before your next appointment, please call your pharmacy*   Lab Work: Your provider would like for you to have following labs drawn today BMET may be needed for the CT.   If you have labs (blood work) drawn today and your tests are completely normal, you will receive your results only by: MyChart Message (if you have MyChart) OR A paper copy in the mail If you have any lab test that is abnormal or we need to change your treatment, we will call you to review the results.   Testing/Procedures: Your physician has requested that you have an echocardiogram. Echocardiography is a painless test that uses sound waves to create images of your heart. It provides your doctor with information about the size and shape of your heart and how well your heart's chambers and valves are working.   You may receive an ultrasound enhancing agent through an IV if needed to better visualize your heart during the echo. This procedure takes approximately one hour.  There are no restrictions for this procedure.  This will take place at [address] [facility]) [address], [place] [address]  Please note: We ask at that you not bring children with you during ultrasound (echo/ vascular) testing. Due to room size and safety concerns, children are not allowed in the ultrasound rooms during exams. Our front office staff cannot provide observation of children in our lobby area while testing is being conducted. An adult accompanying a patient to their appointment will only be allowed in the ultrasound room at the discretion of the ultrasound technician under special circumstances. We apologize for any inconvenience.    Follow-Up: At [facility], you and your health needs are our priority.  As part of our continuing mission to provide you with exceptional heart care, we have created designated Provider Care Teams.   These Care Teams include your primary Cardiologist (physician) and Advanced Practice Providers (APPs -  Physician Assistants and Nurse Practitioners) who all work together to provide you with the care you need, when you need it.  We recommend signing up for the patient portal called "MyChart".  Sign up information is provided on this After Visit Summary.  MyChart is used to connect with patients for Virtual Visits (Telemedicine).  Patients are able to view lab/test results, encounter notes, upcoming appointments, etc.  Non-urgent messages can be sent to your provider as well.   To learn more about what you can do with MyChart, go to [email or web address].    Your next appointment:   Follow up after testing with Dr. Kirke Corin or APP Other Instructions   Your cardiac CT will be scheduled at one of the below locations:   [facility] [address] [place], [place] [address] [phone]  OR  [facility] [address] [place], [place] [address] [phone]  OR   [facility] [address] [place], [place] [address] [phone]  If scheduled at [facility], please arrive at the [facility] and Children's Entrance (Entrance C2) of [facility] 30 minutes prior to test start time. You can use the FREE valet parking offered at entrance C (encouraged to control the heart rate for the test)  Proceed to the [facility] Radiology Department (first floor) to check-in and test prep.  All radiology patients and guests should use entrance C2 at [facility], accessed  from [facility], even though the hospital's physical address listed is [address].    If scheduled at [facility] or [facility], please arrive 15 mins early for check-in and test prep.  There is spacious parking and easy access to the radiology department  from the [facility] Heart and Vascular entrance. Please enter here and check-in with the desk attendant.   Please follow these instructions carefully (unless otherwise directed):  An IV will be required for this test and Nitroglycerin will be given.   On the Night Before the Test: Be sure to Drink plenty of water. Do not consume any caffeinated/decaffeinated beverages or chocolate 12 hours prior to your test. Do not take any antihistamines 12 hours prior to your test.  On the Day of the Test: Drink plenty of water until 1 hour prior to the test. Do not eat any food 1 hour prior to test. You may take your regular medications prior to the test.  Take metoprolol (Lopressor) two hours prior to test. Patients who wear a continuous glucose monitor MUST remove the device prior to scanning. FEMALES- please wear underwire-free bra if available, avoid dresses & tight clothing       After the Test: Drink plenty of water. After receiving IV contrast, you may experience a mild flushed feeling. This is normal. On occasion, you may experience a mild rash up to 24 hours after the test. This is not dangerous. If this occurs, you can take Benadryl 25 mg and increase your fluid intake. If you experience trouble breathing, this can be serious. If it is severe call 911 IMMEDIATELY. If it is mild, please call our office.  We will call to schedule your test 2-4 weeks out understanding that some insurance companies will need an authorization prior to the service being performed.   For more information and frequently asked questions, please visit our website : [email or web address]  For non-scheduling related questions, please contact the cardiac imaging nurse navigator should you have any questions/concerns: Cardiac Imaging Nurse Navigators Direct Office Dial: [phone]   For scheduling needs, including cancellations and rescheduling, please call [place], [phone].

## 2023-11-15 ENCOUNTER — Encounter (HOSPITAL_COMMUNITY): Payer: Self-pay

## 2023-11-16 ENCOUNTER — Telehealth (HOSPITAL_COMMUNITY): Payer: Self-pay | Admitting: *Deleted

## 2023-11-16 NOTE — Telephone Encounter (Signed)
Attempted to call patient regarding upcoming cardiac CT appointment. Unable to leave VM. Damareon Lanni RN Navigator Cardiac Imaging [facility] [phone] Office  

## 2023-11-18 ENCOUNTER — Ambulatory Visit
Admission: RE | Admit: 2023-11-18 | Discharge: 2023-11-18 | Disposition: A | Discharge status: Home / Self Care | Payer: Medicare Other | Source: Ambulatory Visit | Attending: Cardiovascular Disease | Admitting: Cardiovascular Disease

## 2023-11-18 DIAGNOSIS — I2510 Atherosclerotic heart disease of native coronary artery without angina pectoris: Secondary | ICD-10-CM | POA: Diagnosis not present

## 2023-11-18 DIAGNOSIS — R0609 Other forms of dyspnea: Secondary | ICD-10-CM | POA: Insufficient documentation

## 2023-11-18 DIAGNOSIS — I4729 Other ventricular tachycardia: Secondary | ICD-10-CM | POA: Diagnosis not present

## 2023-11-18 MED ORDER — NITROGLYCERIN 0.4 MG SL SUBL
0.8000 mg | SUBLINGUAL_TABLET | Freq: Once | SUBLINGUAL | Status: AC
  Administered 2023-11-18: 0.8 mg via SUBLINGUAL

## 2023-11-18 MED ORDER — SODIUM CHLORIDE 0.9 % IV SOLN: INTRAVENOUS | Status: DC

## 2023-11-18 MED ORDER — IOHEXOL 350 MG/ML SOLN
100.0000 mL | Freq: Once | INTRAVENOUS | Status: AC | PRN
  Administered 2023-11-18: 100 mL via INTRAVENOUS

## 2023-11-18 NOTE — Progress Notes (Signed)
 Patient tolerated procedure well. Ambulate w/o difficulty. Denies light headedness or being dizzy. Sitting up drinking water provided. Encouraged to drink extra water today and reasoning explained. Verbalized understanding. All questions answered. ABC intact. No further needs. Discharge from procedure area w/o issues.

## 2023-11-23 ENCOUNTER — Ambulatory Visit: Payer: Medicare Other | Attending: Cardiovascular Disease

## 2023-11-23 DIAGNOSIS — R0609 Other forms of dyspnea: Secondary | ICD-10-CM | POA: Diagnosis not present

## 2023-11-23 LAB — ECHOCARDIOGRAM COMPLETE
AV Mean grad: 3 mm[Hg]
AV Peak grad: 5.1 mm[Hg]
Ao pk vel: 1.13 m/s

## 2023-11-26 ENCOUNTER — Other Ambulatory Visit: Payer: Self-pay | Admitting: *Deleted

## 2023-11-26 MED ORDER — METOPROLOL SUCCINATE ER 25 MG PO TB24: 25.0000 mg | ORAL_TABLET | Freq: Every day | ORAL | 1 refills | Status: DC

## 2023-11-29 ENCOUNTER — Encounter: Payer: Self-pay | Admitting: Physician Assistant

## 2023-11-29 ENCOUNTER — Ambulatory Visit: Payer: Medicare Other

## 2023-11-29 ENCOUNTER — Ambulatory Visit: Payer: Medicare Other | Attending: Physician Assistant | Admitting: Physician Assistant

## 2023-11-29 VITALS — BP 105/65 | HR 75 | Ht 64.0 in | Wt 180.0 lb

## 2023-11-29 DIAGNOSIS — I5022 Chronic systolic (congestive) heart failure: Secondary | ICD-10-CM

## 2023-11-29 DIAGNOSIS — R946 Abnormal results of thyroid function studies: Secondary | ICD-10-CM

## 2023-11-29 DIAGNOSIS — I4729 Other ventricular tachycardia: Secondary | ICD-10-CM

## 2023-11-29 DIAGNOSIS — I2510 Atherosclerotic heart disease of native coronary artery without angina pectoris: Secondary | ICD-10-CM

## 2023-11-29 DIAGNOSIS — R9389 Abnormal findings on diagnostic imaging of other specified body structures: Secondary | ICD-10-CM

## 2023-11-29 DIAGNOSIS — R42 Dizziness and giddiness: Secondary | ICD-10-CM

## 2023-11-29 DIAGNOSIS — I493 Ventricular premature depolarization: Secondary | ICD-10-CM

## 2023-11-29 NOTE — Patient Instructions (Signed)
 Medication Instructions:  Your Physician recommend you continue on your current medication as directed.    *If you need a refill on your cardiac medications before your next appointment, please call your pharmacy*   Lab Work: None ordered at this time  If you have labs (blood work) drawn today and your tests are completely normal, you will receive your results only by: MyChart Message (if you have MyChart) OR A paper copy in the mail If you have any lab test that is abnormal or we need to change your treatment, we will call you to review the results.   Testing/Procedures: GEOFFRY HEWS- Long Term Monitor Instructions  Your physician has requested you wear a ZIO patch monitor for 14 days.  This is a single patch monitor. [employer] supplies one patch monitor per enrollment. Additional stickers are not available. Please do not apply patch if you will be having a Nuclear Stress Test, Echocardiogram, Cardiac CT, MRI, or Chest Xray during the period you would be wearing the monitor. The patch cannot be worn during these tests. You cannot remove and re-apply the ZIO XT patch monitor.  Your ZIO patch monitor will be mailed 3 day USPS to your address on file. It may take 3-5 days to receive your monitor after you have been enrolled.  Once you have received your monitor, please review the enclosed instructions. Your monitor has already been registered assigning a specific monitor serial # to you.  Billing and Patient Assistance Program Information  We have supplied [employer] with any of your insurance information on file for billing purposes. [employer] offers a sliding scale Patient Assistance Program for patients that do not have  insurance, or whose insurance does not completely cover the cost of the ZIO monitor.  You must apply for the Patient Assistance Program to qualify for this discounted rate.  To apply, please call [employer] at [phone], select option 4, select option 2, ask to apply for Patient  Assistance Program. Meredeth will ask your household income, and how many people are in your household. They will quote your out-of-pocket cost based on that information.  [employer] will also be able to set up a 65-month, interest-free payment plan if needed.  Applying the monitor   Shave hair from upper left chest.  Hold abrader disc by orange tab. Rub abrader in 40 strokes over the upper left chest as  indicated in your monitor instructions.  Clean area with 4 enclosed alcohol pads. Let dry.  Apply patch as indicated in monitor instructions. Patch will be placed under collarbone on left side of chest with arrow pointing upward.  Rub patch adhesive wings for 2 minutes. Remove white label marked 1. Remove the white label marked 2. Rub patch adhesive wings for 2 additional minutes.  While looking in a mirror, press and release button in center of patch. A small green light will  flash 3-4 times. This will be your only indicator that the monitor has been turned on.  Do not shower for the first 24 hours. You may shower after the first 24 hours.  Press the button if you feel a symptom. You will hear a small click. Record Date, Time and  Symptom in the Patient Logbook.  When you are ready to remove the patch, follow instructions on the last 2 pages of Patient  Logbook. Stick patch monitor onto the last page of Patient Logbook.  Place Patient Logbook in the blue and white box. Use locking tab on box and tape box closed  securely. The blue and white box has prepaid postage on it. Please place it in the mailbox as soon as possible. Your physician should have your test results approximately 7 days after the monitor has been mailed back to [facility]  Call [facility] Customer Care at [phone] if you have questions regarding your ZIO XT patch monitor. Call them immediately if you see an orange light blinking on your monitor.  If your monitor falls off in less than 4 days, contact our Monitor  department at [phone].  If your monitor becomes loose or falls off after 4 days call [employer] at [phone] for  suggestions on securing your monitor    Follow-Up: At [facility], you and your health needs are our priority.  As part of our continuing mission to provide you with exceptional heart care, we have created designated Provider Care Teams.  These Care Teams include your primary Cardiologist (physician) and Advanced Practice Providers (APPs -  Physician Assistants and Nurse Practitioners) who all work together to provide you with the care you need, when you need it.  We recommend signing up for the patient portal called MyChart.  Sign up information is provided on this After Visit Summary.  MyChart is used to connect with patients for Virtual Visits (Telemedicine).  Patients are able to view lab/test results, encounter notes, upcoming appointments, etc.  Non-urgent messages can be sent to your provider as well.   To learn more about what you can do with MyChart, go to [email or web address].    Your next appointment:   1 month(s)  Provider:   You may see Deatrice Cage, MD or one of the following Advanced Practice Providers on your designated Care Team:   Bernardino Bring, PA-C

## 2023-11-29 NOTE — Progress Notes (Signed)
 Cardiology Office Note    Date:  11/29/2023   ID:  [name], DOB [birth year], MRN [id number]  PCP:  Cleotilde Oneil FALCON, MD  Cardiologist:  Deatrice Cage, MD  Electrophysiologist:  None   Chief Complaint: Follow up  History of Present Illness:   [name] is a 78 y.o. 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  She was evaluated by Dr. Cage as a new patient in 10/2023 at the request of her PCP for NSVT and presyncope.  At that time, she was unaware of any known cardiac history.  Family history was unknown due to adoption.  She reported having had symptoms of vertigo described as a spinning sensation, especially with certain movements.  In addition, she noted increased palpitations with associated dizziness.  She reported some episodes of LOC where she felt cold and sweaty before passing out.  EKG showed sinus rhythm with fusion complexes.  CTA of the head and neck was unrevealing in 07/2023.  MRI of the brain showed no acute intracranial pathology in 07/2023.  Coronary CTA on 11/18/2023 showed no significant extracardiac findings with a calcium  score of 14.9, which was the 36 percentile.  There was less than 25% proximal left main stenosis.  Echo on 11/23/2023 showed an EF of 40 to 45%, septal wall hypokinesis consistent with bundle branch block, grade 1 diastolic dysfunction, normal RV systolic function and ventricular cavity size, no significant valvular abnormality, and an estimated right atrial pressure of 3 mmHg.  In the setting of cardiomyopathy, she was started on Toprol -XL.  Repeat CTA head and neck in 10/2023 showed 1 to 2 mm laterally projecting vascular protrusions arising from the cavernous internal carotid  arteries bilaterally possibly reflective of aneurysms or infundibula.  Also incidentally noted was a 12 mm cutaneous/subcutaneous lesion within the ventral chest wall just to the right of midline.  She comes in accompanied by her husband and is without symptoms of angina or cardiac decompensation at this time.  She reports 2 episodes of dizziness described as a spinning sensation with associated flushing and emesis on 11/28/2023.  The first episode lasted for approximately 1 hour with the second episode lasting for approximately 30 minutes.  She reports these episodes can be exacerbated by rotational head movement.  No associated chest pain, dyspnea, or palpitations.  No frank syncope.  Just started metoprolol  2 days ago due to delay at the pharmacy.  Now on lower dose levothyroxine  150 mcg.  She also reports PCP has undertaken outpatient cardiac monitoring, results are unavailable for review.   Labs independently reviewed: 09/2023 - Hgb 14.3, PLT 266, potassium 4.6, BUN 24, serum creatinine 0.9, albumin 0.1, AST/ALT normal, TSH 0.024 07/2023 - magnesium 1.9 04/2023 - TC 164, TG 236, HDL 52, LDL 64  Past Medical History:  Diagnosis Date   Arthritis    C. difficile diarrhea    Hypertension    Thyroid  disease     Past Surgical History:  Procedure Laterality Date   TONSILLECTOMY      Current Medications: Current Meds  Medication Sig   atorvastatin  (LIPITOR) 10 MG tablet Take 10 mg by mouth daily.   cyanocobalamin (VITAMIN B12) 1000 MCG/ML injection Inject 100 mcg into the skin every 30 (thirty) days.  folic acid  (FOLVITE ) 1 MG tablet Take 1 mg by mouth daily.   levothyroxine  (SYNTHROID ) 150 MCG tablet Take by mouth.   liothyronine  (CYTOMEL ) 5 MCG tablet Take by mouth.   loratadine (CLARITIN) 10 MG tablet Take 10 mg by mouth daily as needed.   Melatonin 5 MG CAPS Take 5 mg by mouth at bedtime.   metoprolol  succinate (TOPROL -XL) 25 MG 24 hr tablet Take 1 tablet (25 mg total) by mouth daily.    traMADol  (ULTRAM ) 50 MG tablet Take 50 mg by mouth every 6 (six) hours as needed.    Allergies:   Ciprofloxacin   Social History   Socioeconomic History   Marital status: Married    Spouse name: Not on file   Number of children: Not on file   Years of education: Not on file   Highest education level: Not on file  Occupational History   Not on file  Tobacco Use   Smoking status: Every Day    Current packs/day: 1.00    Average packs/day: 1 pack/day for 20.0 years (20.0 ttl pk-yrs)    Types: Cigarettes   Smokeless tobacco: Never  Vaping Use   Vaping status: Never Used  Substance and Sexual Activity   Alcohol use: No   Drug use: Never   Sexual activity: Not on file  Other Topics Concern   Not on file  Social History Narrative   Not on file   Social Drivers of Health   Financial Resource Strain: Low Risk  (05/04/2023)   Received from [facility]   Overall Financial Resource Strain (CARDIA)    Difficulty of Paying Living Expenses: Not hard at all  Food Insecurity: No Food Insecurity (05/04/2023)   Received from [facility]   Hunger Vital Sign    Worried About Running Out of Food in the Last Year: Never true    Ran Out of Food in the Last Year: Never true  Transportation Needs: No Transportation Needs (05/04/2023)   Received from [facility]    In the past 12 months, has lack of transportation kept you from medical appointments or from getting medications?: No    Lack of Transportation (Non-Medical): No  Physical Activity: Not on file  Stress: Not on file  Social Connections: Not on file     Family History:  The patient's She was adopted. Family history is unknown by patient.  ROS:   12-point review of systems is negative unless otherwise noted in the HPI.   EKGs/Labs/Other Studies Reviewed:    Studies reviewed were summarized above. The additional studies were reviewed  today:  Coronary CTA 11/18/2023: FINDINGS: Aorta:  Normal size.  No calcifications.  No dissection.   Aortic Valve:  Trileaflet.  No calcifications.   Coronary Arteries:  Normal coronary origin.  Right dominance.   RCA is a dominant artery. There is no plaque.   Left main gives rise to LAD and LCX arteries. LM has minimal calcification causing minimal stenosis (<25%).   LAD has no plaque.   LCX is a non-dominant artery.  There is no plaque.   Other findings:   Normal pulmonary vein drainage into the left atrium.   Normal left atrial appendage without a thrombus.   Normal size of the pulmonary artery.   IMPRESSION: 1. Coronary calcium  score of 14.9. This was 36th percentile for age and sex matched control. 2. Normal coronary origin with right dominance. 3. Minimal proximal LM  disease (<25%). 4. CAD-RADS 1. Minimal non-obstructive CAD (0-24%). Consider preventive therapy and risk factor modification. __________  2D echo 11/23/2023: 1. Left ventricular ejection fraction, by estimation, is 40 to 45%. The  left ventricle has mildly decreased function. The left ventricle  demonstrates regional wall motion abnormalities (septal wall hypokinesis  possibly consistent with bundle branch  block). Left ventricular diastolic parameters are consistent with Grade I  diastolic dysfunction (impaired relaxation). The average left ventricular  global longitudinal strain is -12.0 %.   2. Right ventricular systolic function is normal. The right ventricular  size is normal.   3. The mitral valve is normal in structure. No evidence of mitral valve  regurgitation. No evidence of mitral stenosis.   4. The aortic valve is normal in structure. Aortic valve regurgitation is  not visualized. No aortic stenosis is present.   5. The inferior vena cava is normal in size with greater than 50%  respiratory variability, suggesting right atrial pressure of 3 mmHg.     EKG:  EKG is ordered today.  The  EKG ordered today demonstrates NSR, 72 bpm, LBBB  Recent Labs: 08/05/2023: ALT 9; BUN 16; Creatinine, Ser 1.05; Hemoglobin 14.8; Magnesium 1.9; Platelets 414; Potassium 4.1; Sodium 135; TSH 29.820  Recent Lipid Panel No results found for: CHOL, TRIG, HDL, CHOLHDL, VLDL, LDLCALC, LDLDIRECT  PHYSICAL EXAM:    VS:  BP 105/65 (BP Location: Left Arm, Patient Position: Sitting, Cuff Size: Normal)   Pulse 75   Ht 5' 4 (1.626 m)   Wt 180 lb (81.6 kg)   SpO2 96%   BMI 30.90 kg/m   BMI: Body mass index is 30.9 kg/m.  Physical Exam Vitals reviewed.  Constitutional:      Appearance: She is well-developed.  HENT:     Head: Normocephalic and atraumatic.  Eyes:     General:        Right eye: No discharge.        Left eye: No discharge.  Neck:     Vascular: No JVD.  Cardiovascular:     Rate and Rhythm: Normal rate and regular rhythm.     Heart sounds: Normal heart sounds, S1 normal and S2 normal. Heart sounds not distant. No midsystolic click and no opening snap. No murmur heard.    No friction rub.  Pulmonary:     Effort: Pulmonary effort is normal. No respiratory distress.     Breath sounds: Normal breath sounds. No decreased breath sounds, wheezing, rhonchi or rales.  Chest:     Chest wall: No tenderness.  Abdominal:     General: There is no distension.  Musculoskeletal:     Cervical back: Normal range of motion.     Right lower leg: No edema.     Left lower leg: No edema.  Skin:    General: Skin is warm and dry.     Nails: There is no clubbing.  Neurological:     Mental Status: She is alert and oriented to person, place, and time.  Psychiatric:        Speech: Speech normal.        Behavior: Behavior normal.        Thought Content: Thought content normal.        Judgment: Judgment normal.     Wt Readings from Last 3 Encounters:  11/29/23 180 lb (81.6 kg)  08/05/23 185 lb (83.9 kg)  04/02/17 240 lb (108.9 kg)     ASSESSMENT & PLAN:   HFmrEF secondary  to NICM: She appears euvolemic and well compensated.  No evidence of obstructive CAD by coronary CTA as outlined above.  Now on Toprol -XL 25 mg daily.  Relative hypotension precludes further escalation of GDMT at this time, advance as tolerated.  Not requiring standing loop diuretic.  Cardiomyopathy may be in the setting of ventricular ectopy.  Nonobstructive CAD, aortic atherosclerosis, HLD: No symptoms suggestive of angina.  LDL 64 in 04/2023.  Remains on atorvastatin  10 mg.  Frequent PVCs and NSVT: Improved burden noted on twelve-lead EKG in the office today.  Now on Toprol -XL 25 mg.  BP precludes further titration of beta-blocker.  Ventricular ectopy possibly contributing to cardiomyopathy.  Place Zio patch to quantify ventricular ectopy/PVC burden (patient reports outpatient cardiac monitor was done through PCPs office recently, report unavailable for review).  May be exacerbated by thyroid  function.  Potassium and magnesium normal.  Dizziness: Presentation appears to be consistent with possible vertigo as this is described as a spinning sensation.  However, cannot exclude association with ventricular ectopy burden.  Place Zio patch in an effort to try and correlate her symptoms as outlined above.  May need neurology evaluation.  Pending Zio patch and correlation, ongoing management per PCP.  CTA head/neck findings: CTA head and neck in 10/2023 with findings of 12 mm cutaneous/subcutaneous lesion within the ventral chest wall just to the right of midline with direct examination recommended.  Also finding of 1 to 2 mm laterally projecting vascular protrusions arising from the cavernous internal carotid arteries bilaterally, possibly reflective of aneurysm or infundibula.  Patient made aware.  Further management per PCP, and medicine/if felt clinically indicated.  Hypothyroidism: Most recent TSH point suppressed at 0.024.  Patient reports she is now on lower dose levothyroxine  150 mcg.  Possibly  contributing to ventricular ectopy.  Management per PCP.    Disposition: F/u with Dr. Darron or an APP in 1-2 months.   Medication Adjustments/Labs and Tests Ordered: Current medicines are reviewed at length with the patient today.  Concerns regarding medicines are outlined above. Medication changes, Labs and Tests ordered today are summarized above and listed in the Patient Instructions accessible in Encounters.   Signed, Bernardino Bring, PA-C 11/29/2023 4:42 PM     [facility] [place] [address] [place], [place] [address] [phone]

## 2024-01-04 ENCOUNTER — Ambulatory Visit: Payer: Medicare Other | Admitting: Cardiovascular Disease

## 2024-01-25 ENCOUNTER — Ambulatory Visit: Payer: Medicare Other | Attending: Physician Assistant | Admitting: Physician Assistant

## 2024-01-25 NOTE — Progress Notes (Deleted)
 Cardiology Office Note    Date:  01/25/2024   ID:  [name], DOB Jul 17, 1946, MRN [id number]  PCP:  Danella Penton, MD  Cardiologist:  Lorine Bears, MD  Electrophysiologist:  None   Chief Complaint: Follow-up  History of Present Illness:   [name] is a 78 y.o. 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   She was evaluated by Dr. Kirke Corin as a new patient in 10/2023 at the request of her PCP for NSVT and presyncope.  At that time, she was unaware of any known cardiac history.  Family history was unknown due to adoption.  She reported having had symptoms of vertigo described as a spinning sensation, especially with certain movements.  In addition, she noted increased palpitations with associated dizziness.  She reported some episodes of LOC where she felt cold and sweaty before passing out.  EKG showed sinus rhythm with fusion complexes.  CTA of the head and neck was unrevealing in 07/2023.  MRI of the brain showed no acute intracranial pathology in 07/2023.  Coronary CTA on 11/18/2023 showed no significant extracardiac findings with a calcium score of 14.9, which was the 36 percentile.  There was less than 25% proximal left main stenosis.  Echo on 11/23/2023 showed an EF of 40 to 45%, septal wall hypokinesis consistent with bundle branch block, grade 1 diastolic dysfunction, normal RV systolic function and ventricular cavity size, no significant valvular abnormality, and an estimated right atrial pressure of 3 mmHg.  In the setting of cardiomyopathy, she was started on Toprol-XL.  Repeat CTA head and neck in 10/2023 showed 1 to 2 mm laterally projecting vascular protrusions arising from the cavernous internal carotid arteries bilaterally  possibly reflective of aneurysms or infundibula.  Also incidentally noted was a 12 mm cutaneous/subcutaneous lesion within the ventral chest wall just to the right of midline.  She was last seen in the office in 11/2023 and was without symptoms of angina or cardiac decompensation.  She reported 2 episodes of dizziness described as a spinning sensation with associated flushing and emesis the day prior.  Episodes were exacerbated by rotational head movement.  She had just started metoprolol 2 days prior and was on lower dose of levothyroxine.  She also reported PCP had undertaken outpatient cardiac monitoring with results unavailable for review.  We recommended Zio patch monitor to quantify ventricular ectopy/PVC burden with results pending at this time.  ***   Labs independently reviewed: 09/2023 - Hgb 14.3, PLT 266, potassium 4.6, BUN 24, serum creatinine 0.9, albumin 0.1, AST/ALT normal, TSH 0.024 07/2023 - magnesium 1.9 04/2023 - TC 164, TG 236, HDL 52, LDL 64  Past Medical History:  Diagnosis Date   Arthritis    C. difficile diarrhea    Hypertension    Thyroid disease     Past Surgical History:  Procedure Laterality Date   TONSILLECTOMY      Current Medications: No outpatient medications have been marked as taking for the 01/25/24 encounter (Appointment) with Sondra Barges, PA-C.    Allergies:   Ciprofloxacin   Social History   Socioeconomic History   Marital status: Married    Spouse name: Not on file   Number of children: Not on file   Years of education: Not on  file   Highest education level: Not on file  Occupational History   Not on file  Tobacco Use   Smoking status: Every Day    Current packs/day: 1.00    Average packs/day: 1 pack/day for 20.0 years (20.0 ttl pk-yrs)    Types: Cigarettes   Smokeless tobacco: Never  Vaping Use   Vaping status: Never Used  Substance and Sexual Activity   Alcohol use: No   Drug use: Never   Sexual activity: Not on file  Other  Topics Concern   Not on file  Social History Narrative   Not on file   Social Drivers of Health   Financial Resource Strain: Low Risk  (05/04/2023)   Received from [facility]   Overall Financial Resource Strain (CARDIA)    Difficulty of Paying Living Expenses: Not hard at all  Food Insecurity: No Food Insecurity (05/04/2023)   Received from [facility]   Hunger Vital Sign    Worried About Running Out of Food in the Last Year: Never true    Ran Out of Food in the Last Year: Never true  Transportation Needs: No Transportation Needs (05/04/2023)   Received [facility] - Transportation    In the past 12 months, has lack of transportation kept you from medical appointments or from getting medications?: No    Lack of Transportation (Non-Medical): No  Physical Activity: Not on file  Stress: Not on file  Social Connections: Not on file     Family History:  The patient's She was adopted. Family history is unknown by patient.  ROS:   12-point review of systems is negative unless otherwise noted in the HPI.   EKGs/Labs/Other Studies Reviewed:    Studies reviewed were summarized above. The additional studies were reviewed today:  Coronary CTA 11/18/2023: FINDINGS: Aorta:  Normal size.  No calcifications.  No dissection.   Aortic Valve:  Trileaflet.  No calcifications.   Coronary Arteries:  Normal coronary origin.  Right dominance.   RCA is a dominant artery. There is no plaque.   Left main gives rise to LAD and LCX arteries. LM has minimal calcification causing minimal stenosis (<25%).   LAD has no plaque.   LCX is a non-dominant artery.  There is no plaque.   Other findings:   Normal pulmonary vein drainage into the left atrium.   Normal left atrial appendage without a thrombus.   Normal size of the pulmonary artery.   IMPRESSION: 1. Coronary calcium score of 14.9. This was 36th percentile for  age and sex matched control. 2. Normal coronary origin with right dominance. 3. Minimal proximal LM disease (<25%). 4. CAD-RADS 1. Minimal non-obstructive CAD (0-24%). Consider preventive therapy and risk factor modification. __________   2D echo 11/23/2023: 1. Left ventricular ejection fraction, by estimation, is 40 to 45%. The  left ventricle has mildly decreased function. The left ventricle  demonstrates regional wall motion abnormalities (septal wall hypokinesis  possibly consistent with bundle branch  block). Left ventricular diastolic parameters are consistent with Grade I  diastolic dysfunction (impaired relaxation). The average left ventricular  global longitudinal strain is -12.0 %.   2. Right ventricular systolic function is normal. The right ventricular  size is normal.   3. The mitral valve is normal in structure. No evidence of mitral valve  regurgitation. No evidence of mitral stenosis.   4. The aortic valve is normal in structure. Aortic valve regurgitation is  not  visualized. No aortic stenosis is present.   5. The inferior vena cava is normal in size with greater than 50%  respiratory variability, suggesting right atrial pressure of 3 mmHg.  __________  Luci Bank patch pending   EKG:  EKG is ordered today.  The EKG ordered today demonstrates ***  Recent Labs: 08/05/2023: ALT 9; BUN 16; Creatinine, Ser 1.05; Hemoglobin 14.8; Magnesium 1.9; Platelets 414; Potassium 4.1; Sodium 135; TSH 29.820  Recent Lipid Panel No results found for: "CHOL", "TRIG", "HDL", "CHOLHDL", "VLDL", "LDLCALC", "LDLDIRECT"  PHYSICAL EXAM:    VS:  There were no vitals taken for this visit.  BMI: There is no height or weight on file to calculate BMI.  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 stress test, cath, DCCV, TEE, etc)?   Press F2        :[id number]}     Disposition: F/u with Dr. Kirke Corin or an APP in ***.   Medication Adjustments/Labs and Tests Ordered: Current medicines are reviewed at length with the patient today.  Concerns regarding medicines are outlined above. Medication changes, Labs and Tests ordered today are summarized above and listed in the Patient Instructions accessible in Encounters.   Signed, Eula Listen, PA-C 01/25/2024 10:08 AM     [facility] [place] [address] [place], [place] [address] [phone]

## 2024-05-13 ENCOUNTER — Other Ambulatory Visit: Payer: Self-pay | Admitting: Cardiovascular Disease

## 2024-05-15 NOTE — Telephone Encounter (Signed)
 Please contact pt for future appointment. Pt overdue f/u for 1 month. Pt needing refills.

## 2024-05-15 NOTE — Telephone Encounter (Signed)
 Left voice mail

## 2024-05-15 NOTE — Telephone Encounter (Signed)
 Pt's medication has already been sent to pt's pharmacy, enough medication to get pt to appt on 05/23/24 with Cardiologist. Confirmation received.

## 2024-05-15 NOTE — Telephone Encounter (Signed)
 Please contact pt for future appointment. Pt overdue for 1 month f/u. Pt needing refills.

## 2024-05-15 NOTE — Telephone Encounter (Signed)
 Appointment scheduled for 05/23/24

## 2024-05-23 ENCOUNTER — Encounter: Payer: Self-pay | Admitting: Cardiovascular Disease

## 2024-05-23 ENCOUNTER — Ambulatory Visit: Attending: Cardiovascular Disease | Admitting: Cardiovascular Disease

## 2024-05-23 VITALS — BP 120/80 | HR 81 | Ht 66.0 in | Wt 193.4 lb

## 2024-05-23 DIAGNOSIS — Z720 Tobacco use: Secondary | ICD-10-CM

## 2024-05-23 DIAGNOSIS — E785 Hyperlipidemia, unspecified: Secondary | ICD-10-CM

## 2024-05-23 DIAGNOSIS — I493 Ventricular premature depolarization: Secondary | ICD-10-CM | POA: Diagnosis not present

## 2024-05-23 DIAGNOSIS — I5022 Chronic systolic (congestive) heart failure: Secondary | ICD-10-CM

## 2024-05-23 NOTE — Patient Instructions (Signed)
 Medication Instructions:  No changes *If you need a refill on your cardiac medications before your next appointment, please call your pharmacy*  Lab Work: None ordered If you have labs (blood work) drawn today and your tests are completely normal, you will receive your results only by: MyChart Message (if you have MyChart) OR A paper copy in the mail If you have any lab test that is abnormal or we need to change your treatment, we will call you to review the results.  Testing/Procedures: None ordered  Follow-Up: At [facility], you and your health needs are our priority.  As part of our continuing mission to provide you with exceptional heart care, our providers are all part of one team.  This team includes your primary Cardiologist (physician) and Advanced Practice Providers or APPs (Physician Assistants and Nurse Practitioners) who all work together to provide you with the care you need, when you need it.  Your next appointment:   6 month(s)  Provider:   You may see Deatrice Cage, MD or one of the following Advanced Practice Providers on your designated Care Team:   Lonni Meager, NP Lesley Maffucci, PA-C Bernardino Bring, PA-C Cadence [place], PA-C Tylene Lunch, NP Barnie Hila, NP    We recommend signing up for the patient portal called MyChart.  Sign up information is provided on this After Visit Summary.  MyChart is used to connect with patients for Virtual Visits (Telemedicine).  Patients are able to view lab/test results, encounter notes, upcoming appointments, etc.  Non-urgent messages can be sent to your provider as well.   To learn more about what you can do with MyChart, go to [email or web address].   Other Instructions Managing the Challenge of Quitting Smoking Quitting smoking is a physical and mental challenge. You may have cravings, withdrawal symptoms, and temptation to smoke. Before quitting, work with your health care provider to make a plan that can  help you manage quitting. Making a plan before you quit may keep you from smoking when you have the urge to smoke while trying to quit. How to manage lifestyle changes Managing stress Stress can make you want to smoke, and wanting to smoke may cause stress. It is important to find ways to manage your stress. You could try some of the following: Practice relaxation techniques. Breathe slowly and deeply, in through your nose and out through your mouth. Listen to music. Soak in a bath or take a shower. Imagine a peaceful place or vacation. Get some support. Talk with family or friends about your stress. Join a support group. Talk with a counselor or therapist. Get some physical activity. Go for a walk, run, or bike ride. Play a favorite sport. Practice yoga.  Medicines Talk with your health care provider about medicines that might help you deal with cravings and make quitting easier for you. Relationships Social situations can be difficult when you are quitting smoking. To manage this, you can: Avoid parties and other social situations where people might be smoking. Avoid alcohol. Leave right away if you have the urge to smoke. Explain to your family and friends that you are quitting smoking. Ask for support and let them know you might be a bit grumpy. Plan activities where smoking is not an option. General instructions Be aware that many people gain weight after they quit smoking. However, not everyone does. To keep from gaining weight, have a plan in place before you quit, and stick to the plan after you quit. Your plan should  include: Eating healthy snacks. When you have a craving, it may help to: Eat popcorn, or try carrots, celery, or other cut vegetables. Chew sugar-free gum. Changing how you eat. Eat small portion sizes at meals. Eat 4-6 small meals throughout the day instead of 1-2 large meals a day. Be mindful when you eat. You should avoid watching television or doing other  things that might distract you as you eat. Exercising regularly. Make time to exercise each day. If you do not have time for a long workout, do short bouts of exercise for 5-10 minutes several times a day. Do some form of strengthening exercise, such as weight lifting. Do some exercise that gets your heart beating and causes you to breathe deeply, such as walking fast, running, swimming, or biking. This is very important. Drinking plenty of water or other low-calorie or no-calorie drinks. Drink enough fluid to keep your urine pale yellow.  How to recognize withdrawal symptoms Your body and mind may experience discomfort as you try to get used to not having nicotine in your system. These effects are called withdrawal symptoms. They may include: Feeling hungrier than normal. Having trouble concentrating. Feeling irritable or restless. Having trouble sleeping. Feeling depressed. Craving a cigarette. These symptoms may surprise you, but they are normal to have when quitting smoking. To manage withdrawal symptoms: Avoid places, people, and activities that trigger your cravings. Remember why you want to quit. Get plenty of sleep. Avoid coffee and other drinks that contain caffeine. These may worsen some of your symptoms. How to manage cravings Come up with a plan for how to deal with your cravings. The plan should include the following: A definition of the specific situation you want to deal with. An activity or action you will take to replace smoking. A clear idea for how this action will help. The name of someone who could help you with this. Cravings usually last for 5-10 minutes. Consider taking the following actions to help you with your plan to deal with cravings: Keep your mouth busy. Chew sugar-free gum. Suck on hard candies or a straw. Brush your teeth. Keep your hands and body busy. Change to a different activity right away. Squeeze or play with a ball. Do an activity or a  hobby, such as making bead jewelry, practicing needlepoint, or working with wood. Mix up your normal routine. Take a short exercise break. Go for a quick walk, or run up and down stairs. Focus on doing something kind or helpful for someone else. Call a friend or family member to talk during a craving. Join a support group. Contact a quitline. Where to find support To get help or find a support group: Call the National Cancer Institute's Smoking Quitline: 1-800-QUIT-NOW 857-256-9474) Text QUIT to SmokefreeTXT: 521151 Where to find more information Visit these websites to find more information on quitting smoking: U.S. Department of Health and Human Services: www.smokefree.gov American Lung Association: www.freedomfromsmoking.org Centers for Disease Control and Prevention (CDC): FootballExhibition.com.br American Heart Association: www.heart.org Contact a health care provider if: You want to change your plan for quitting. The medicines you are taking are not helping. Your eating feels out of control or you cannot sleep. You feel depressed or become very anxious. Summary Quitting smoking is a physical and mental challenge. You will face cravings, withdrawal symptoms, and temptation to smoke again. Preparation can help you as you go through these challenges. Try different techniques to manage stress, handle social situations, and prevent weight gain. You can deal with  cravings by keeping your mouth busy (such as by chewing gum), keeping your hands and body busy, calling family or friends, or contacting a quitline for people who want to quit smoking. You can deal with withdrawal symptoms by avoiding places where people smoke, getting plenty of rest, and avoiding drinks that contain caffeine. This information is not intended to replace advice given to you by your health care provider. Make sure you discuss any questions you have with your health care provider. Document Revised: 10/24/2021 Document Reviewed:  10/24/2021 Elsevier Patient Education  2024 [employer].

## 2024-05-23 NOTE — Progress Notes (Signed)
 Cardiology Office Note   Date:  05/23/2024   ID:  [name], DOB 10/10/1946, MRN [id number]  PCP:  Cleotilde Oneil FALCON, MD  Cardiologist:   Deatrice Cage, MD   Chief Complaint  Patient presents with   Follow-up    OD f/u c/o blurred vision. Meds reviewed verbally with pt.      History of Present Illness: [name] is a 78 y.o. female who is here for follow-up visit regarding mild nonobstructive coronary artery disease and chronic systolic heart failure due to nonischemic cardiomyopathy.    She has known history of hyperlipidemia, severe osteoarthritis of both knees, hypothyroidism on replacement and tobacco use.  She smokes 1 pack/day.  She was initially seen in December due to frequent PVCs and presyncope.  MRI of the brain showed no acute intracranial abnormality.  Cardiac CTA showed a calcium score of 14.9 with mild nonobstructive disease.  Echocardiogram showed an EF of 40 to 45% with underlying left bundle branch block.  She was started on small dose Toprol  with improvement in symptoms.  The dose of levothyroxine was decreased from 200 to 150 mcg. She is feeling better overall.  She continues to smoke.   Past Medical History:  Diagnosis Date   Arthritis    C. difficile diarrhea    Hypertension    Thyroid  disease     Past Surgical History:  Procedure Laterality Date   TONSILLECTOMY       Current Outpatient Medications  Medication Sig Dispense Refill   atorvastatin (LIPITOR) 10 MG tablet Take 10 mg by mouth daily.     cyanocobalamin (VITAMIN B12) 1000 MCG/ML injection Inject 100 mcg into the skin every 30 (thirty) days.     escitalopram (LEXAPRO) 10 MG tablet Take 10 mg by mouth daily.     folic acid (FOLVITE) 1 MG tablet Take 1 mg by mouth daily.     levothyroxine (SYNTHROID) 150 MCG tablet Take by mouth.     liothyronine (CYTOMEL) 5 MCG tablet Take by mouth.     loratadine (CLARITIN) 10 MG tablet Take 10 mg by mouth daily as needed.     meclizine  (ANTIVERT )  25 MG tablet Take 25 mg by mouth 2 (two) times daily as needed.     Melatonin 5 MG CAPS Take 5 mg by mouth at bedtime.     metoprolol  succinate (TOPROL -XL) 25 MG 24 hr tablet TAKE 1 TABLET(25 MG) BY MOUTH DAILY 30 tablet 0   traMADol (ULTRAM) 50 MG tablet Take 50 mg by mouth every 6 (six) hours as needed.     traZODone (DESYREL) 50 MG tablet Take 50 mg by mouth at bedtime.     hydrOXYzine (ATARAX) 10 MG tablet Take 10 mg by mouth 3 (three) times daily.     levothyroxine (SYNTHROID) 200 MCG tablet Take 200 mcg by mouth daily. (Patient not taking: Reported on 05/23/2024)     No current facility-administered medications for this visit.    Allergies:   Ciprofloxacin    Social History:  The patient  reports that she has been smoking cigarettes. She has a 20 pack-year smoking history. She has never used smokeless tobacco. She reports that she does not drink alcohol and does not use drugs.   Family History:  The patient's She was adopted. Family history is unknown by patient.    ROS:  Please see the history of present illness.   Otherwise, review of systems are positive for none.   All other systems  are reviewed and negative.    PHYSICAL EXAM: VS:  BP 120/80 (BP Location: Left Arm, Patient Position: Sitting, Cuff Size: Large)   Pulse 81   Ht 5' 6 (1.676 m)   Wt 193 lb 6 oz (87.7 kg)   SpO2 97%   BMI 31.21 kg/m  , BMI Body mass index is 31.21 kg/m. GEN: Well nourished, well developed, in no acute distress  HEENT: normal  Neck: no JVD, carotid bruits, or masses Cardiac: RRR; no murmurs, rubs, or gallops,no edema  Respiratory:  clear to auscultation bilaterally, normal work of breathing GI: soft, nontender, nondistended, + BS MS: no deformity or atrophy  Skin: warm and dry, no rash Neuro:  Strength and sensation are intact Psych: euthymic mood, full affect   EKG:  EKG is ordered today. The ekg ordered today demonstrates : Normal sinus rhythm Normal ECG When compared with ECG of  29-Nov-2023 15:07, Left bundle branch block is no longer Present    Recent Labs: 08/05/2023: ALT 9; BUN 16; Creatinine, Ser 1.05; Hemoglobin 14.8; Magnesium 1.9; Platelets 414; Potassium 4.1; Sodium 135; TSH 29.820    Lipid Panel No results found for: CHOL, TRIG, HDL, CHOLHDL, VLDL, LDLCALC, LDLDIRECT    Wt Readings from Last 3 Encounters:  05/23/24 193 lb 6 oz (87.7 kg)  11/29/23 180 lb (81.6 kg)  08/05/23 185 lb (83.9 kg)          11/04/2023   10:55 AM  PAD Screen  Previous PAD dx? No  Previous surgical procedure? No  Pain with walking? No  Feet/toe relief with dangling? No  Painful, non-healing ulcers? No  Extremities discolored? No      ASSESSMENT AND PLAN:  1. 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  Continue treatment with Toprol .  Blood pressure has been on the low side and thus she was not started on ACE inhibitor or ARB.  2.  Nonobstructive coronary artery disease and aortic atherosclerosis: Continue treatment with atorvastatin.  Most recent LDL was 64.  3.  Tobacco use: I again discussed the importance of smoking cessation.  4.  Hyperlipidemia: Currently on atorvastatin with most recent LDL of 64.  4.  PVCs: Well-controlled with Toprol .  In addition, her EKG today does not show a left bundle branch block.    Disposition:   FU after 6 months.   Signed,  Deatrice Cage, MD  05/23/2024 3:11 PM    [facility]

## 2024-06-14 ENCOUNTER — Other Ambulatory Visit: Payer: Self-pay | Admitting: Cardiovascular Disease

## 2024-07-01 ENCOUNTER — Inpatient Hospital Stay
Admission: EM | Admit: 2024-07-01 | Discharge: 2024-07-06 | DRG: 270 | Disposition: A | Discharge status: Home / Self Care | Attending: Student | Admitting: Student

## 2024-07-01 ENCOUNTER — Emergency Department

## 2024-07-01 ENCOUNTER — Other Ambulatory Visit: Payer: Self-pay

## 2024-07-01 ENCOUNTER — Encounter: Payer: Self-pay | Admitting: Intensive Care

## 2024-07-01 DIAGNOSIS — I110 Hypertensive heart disease with heart failure: Secondary | ICD-10-CM | POA: Diagnosis present

## 2024-07-01 DIAGNOSIS — J189 Pneumonia, unspecified organism: Secondary | ICD-10-CM | POA: Diagnosis not present

## 2024-07-01 DIAGNOSIS — F411 Generalized anxiety disorder: Secondary | ICD-10-CM | POA: Diagnosis present

## 2024-07-01 DIAGNOSIS — I82441 Acute embolism and thrombosis of right tibial vein: Secondary | ICD-10-CM | POA: Diagnosis present

## 2024-07-01 DIAGNOSIS — Z7901 Long term (current) use of anticoagulants: Secondary | ICD-10-CM

## 2024-07-01 DIAGNOSIS — I82411 Acute embolism and thrombosis of right femoral vein: Secondary | ICD-10-CM | POA: Diagnosis not present

## 2024-07-01 DIAGNOSIS — I5022 Chronic systolic (congestive) heart failure: Secondary | ICD-10-CM | POA: Diagnosis present

## 2024-07-01 DIAGNOSIS — I82431 Acute embolism and thrombosis of right popliteal vein: Secondary | ICD-10-CM | POA: Diagnosis present

## 2024-07-01 DIAGNOSIS — Z881 Allergy status to other antibiotic agents status: Secondary | ICD-10-CM

## 2024-07-01 DIAGNOSIS — I2510 Atherosclerotic heart disease of native coronary artery without angina pectoris: Secondary | ICD-10-CM | POA: Diagnosis present

## 2024-07-01 DIAGNOSIS — F419 Anxiety disorder, unspecified: Secondary | ICD-10-CM | POA: Diagnosis present

## 2024-07-01 DIAGNOSIS — E785 Hyperlipidemia, unspecified: Secondary | ICD-10-CM | POA: Diagnosis present

## 2024-07-01 DIAGNOSIS — I10 Essential (primary) hypertension: Secondary | ICD-10-CM | POA: Diagnosis present

## 2024-07-01 DIAGNOSIS — I2609 Other pulmonary embolism with acute cor pulmonale: Secondary | ICD-10-CM | POA: Diagnosis present

## 2024-07-01 DIAGNOSIS — E039 Hypothyroidism, unspecified: Secondary | ICD-10-CM | POA: Diagnosis present

## 2024-07-01 DIAGNOSIS — J90 Pleural effusion, not elsewhere classified: Secondary | ICD-10-CM | POA: Diagnosis not present

## 2024-07-01 DIAGNOSIS — F32A Depression, unspecified: Secondary | ICD-10-CM | POA: Diagnosis present

## 2024-07-01 DIAGNOSIS — R5381 Other malaise: Secondary | ICD-10-CM | POA: Diagnosis present

## 2024-07-01 DIAGNOSIS — F17210 Nicotine dependence, cigarettes, uncomplicated: Secondary | ICD-10-CM | POA: Diagnosis present

## 2024-07-01 DIAGNOSIS — M1710 Unilateral primary osteoarthritis, unspecified knee: Secondary | ICD-10-CM | POA: Diagnosis present

## 2024-07-01 DIAGNOSIS — I82451 Acute embolism and thrombosis of right peroneal vein: Secondary | ICD-10-CM | POA: Diagnosis present

## 2024-07-01 DIAGNOSIS — Z1152 Encounter for screening for COVID-19: Secondary | ICD-10-CM

## 2024-07-01 DIAGNOSIS — Z79899 Other long term (current) drug therapy: Secondary | ICD-10-CM

## 2024-07-01 DIAGNOSIS — G8929 Other chronic pain: Secondary | ICD-10-CM | POA: Diagnosis present

## 2024-07-01 DIAGNOSIS — I428 Other cardiomyopathies: Secondary | ICD-10-CM | POA: Diagnosis present

## 2024-07-01 DIAGNOSIS — J9601 Acute respiratory failure with hypoxia: Secondary | ICD-10-CM | POA: Diagnosis present

## 2024-07-01 DIAGNOSIS — D649 Anemia, unspecified: Secondary | ICD-10-CM | POA: Diagnosis present

## 2024-07-01 DIAGNOSIS — Z79890 Hormone replacement therapy (postmenopausal): Secondary | ICD-10-CM

## 2024-07-01 DIAGNOSIS — L400 Psoriasis vulgaris: Secondary | ICD-10-CM | POA: Diagnosis present

## 2024-07-01 LAB — BASIC METABOLIC PANEL WITH GFR
Anion gap: 12 (ref 5–15)
BUN: 23 mg/dL (ref 8–23)
CO2: 21 mmol/L — ABNORMAL LOW (ref 22–32)
Calcium: 9.1 mg/dL (ref 8.9–10.3)
Chloride: 103 mmol/L (ref 98–111)
Creatinine, Ser: 0.94 mg/dL (ref 0.44–1.00)
GFR, Estimated: >60 mL/min (ref >60)
Glucose, Bld: 119 mg/dL — ABNORMAL HIGH (ref 70–99)
Potassium: 4 mmol/L (ref 3.5–5.1)
Sodium: 136 mmol/L (ref 135–145)

## 2024-07-01 LAB — CBC
HCT: 43.1 % (ref 36.0–46.0)
Hemoglobin: 14.9 g/dL (ref 12.0–15.0)
MCH: 29.9 pg (ref 26.0–34.0)
MCHC: 34.6 g/dL (ref 30.0–36.0)
MCV: 86.5 fL (ref 80.0–100.0)
Platelets: 197 K/uL (ref 150–400)
RBC: 4.98 MIL/uL (ref 3.87–5.11)
RDW: 12.5 % (ref 11.5–15.5)
WBC: 16.6 K/uL — ABNORMAL HIGH (ref 4.0–10.5)
nRBC: 0 % (ref 0.0–0.2)

## 2024-07-01 LAB — TROPONIN I (HIGH SENSITIVITY): Troponin I (High Sensitivity): 12 ng/L (ref <18)

## 2024-07-01 MED ORDER — KETOROLAC TROMETHAMINE 15 MG/ML IJ SOLN
15.0000 mg | INTRAMUSCULAR | Status: AC
  Administered 2024-07-01: 15 mg via INTRAVENOUS
  Filled 2024-07-01: qty 1

## 2024-07-01 MED ORDER — SODIUM CHLORIDE 0.9 % IV SOLN
500.0000 mg | Freq: Once | INTRAVENOUS | Status: DC
  Filled 2024-07-01: qty 5

## 2024-07-01 MED ORDER — SODIUM CHLORIDE 0.9 % IV SOLN
500.0000 mg | INTRAVENOUS | Status: DC
  Filled 2024-07-01: qty 5

## 2024-07-01 MED ORDER — MORPHINE SULFATE (PF) 2 MG/ML IV SOLN
1.0000 mg | INTRAVENOUS | Status: DC | PRN
  Administered 2024-07-02: 1 mg via INTRAVENOUS
  Filled 2024-07-01: qty 1

## 2024-07-01 MED ORDER — ENOXAPARIN SODIUM 40 MG/0.4ML IJ SOSY
40.0000 mg | PREFILLED_SYRINGE | INTRAMUSCULAR | Status: DC
  Administered 2024-07-01: 40 mg via SUBCUTANEOUS
  Filled 2024-07-01: qty 0.4

## 2024-07-01 MED ORDER — LEVOFLOXACIN IN D5W 750 MG/150ML IV SOLN
750.0000 mg | Freq: Once | INTRAVENOUS | Status: AC
  Administered 2024-07-01: 750 mg via INTRAVENOUS
  Filled 2024-07-01: qty 150

## 2024-07-01 MED ORDER — SODIUM CHLORIDE 0.9 % IV SOLN
2.0000 g | INTRAVENOUS | Status: DC
  Filled 2024-07-01: qty 20

## 2024-07-01 MED ORDER — LACTATED RINGERS IV SOLN: INTRAVENOUS | Status: AC

## 2024-07-01 MED ORDER — TRAMADOL HCL 50 MG PO TABS
50.0000 mg | ORAL_TABLET | Freq: Once | ORAL | Status: AC
  Administered 2024-07-01: 50 mg via ORAL
  Filled 2024-07-01: qty 1

## 2024-07-01 MED ORDER — SODIUM CHLORIDE 0.9 % IV BOLUS
500.0000 mL | Freq: Once | INTRAVENOUS | Status: AC
  Administered 2024-07-01: 500 mL via INTRAVENOUS

## 2024-07-01 MED ORDER — ONDANSETRON HCL 4 MG/2ML IJ SOLN
4.0000 mg | Freq: Once | INTRAMUSCULAR | Status: AC
  Administered 2024-07-01: 4 mg via INTRAVENOUS
  Filled 2024-07-01: qty 2

## 2024-07-01 MED ORDER — KETOROLAC TROMETHAMINE 15 MG/ML IJ SOLN
15.0000 mg | Freq: Four times a day (QID) | INTRAMUSCULAR | Status: DC | PRN
  Administered 2024-07-02: 15 mg via INTRAVENOUS
  Filled 2024-07-01: qty 1

## 2024-07-01 NOTE — ED Provider Notes (Signed)
 [facility] Provider Note    Event Date/Time   First MD Initiated Contact with Patient 07/01/24 1455     (approximate)   History   Chest Pain   HPI  [name] is a 78 y.o. female with a history of CHF, hypertension, arthritis, and thyroid  disease who presents with chest pain, gradual onset over the last few days, on the left side of her chest, constant, and described as a pressure.  She reports associated shortness of breath and some mild cough although states that it hurts to cough.  She reports nausea but no vomiting.  She feels tired but denies any dizziness or lightheadedness.  She has no leg swelling.  I reviewed the past medical records for the patient's most recent outpatient encounter was on 7/8 with Dr. Darron from cardiology for follow-up of nonobstructive CAD.   Physical Exam   Triage Vital Signs: ED Triage Vitals  Encounter Vitals Group     BP 07/01/24 1254 114/69     Girls Systolic BP Percentile --      Girls Diastolic BP Percentile --      Boys Systolic BP Percentile --      Boys Diastolic BP Percentile --      Pulse Rate 07/01/24 1250 80     Resp 07/01/24 1250 18     Temp 07/01/24 1250 98.3 F (36.8 C)     Temp Source 07/01/24 1250 Oral     SpO2 07/01/24 1250 94 %     Weight 07/01/24 1251 200 lb (90.7 kg)     Height 07/01/24 1251 5' 6 (1.676 m)     Head Circumference --      Peak Flow --      Pain Score 07/01/24 1251 8     Pain Loc --      Pain Education --      Exclude from Growth Chart --     Most recent vital signs: Vitals:   07/01/24 1730 07/01/24 1745  BP:    Pulse:  75  Resp:    Temp: 99.6 F (37.6 C)   SpO2:  98%     General: Awake, no distress.  CV:  Good peripheral perfusion.  Normal heart sounds. Resp:  Normal effort.  Slightly diminished breath sounds left lung. Abd:  No distention.  Other:  No significant peripheral edema.   ED Results / Procedures / Treatments   Labs (all labs ordered are  listed, but only abnormal results are displayed) Labs Reviewed  BASIC METABOLIC PANEL WITH GFR - Abnormal; Notable for the following components:      Result Value   CO2 21 (*)    Glucose, Bld 119 (*)    All other components within normal limits  CBC - Abnormal; Notable for the following components:   WBC 16.6 (*)    All other components within normal limits  TROPONIN I (HIGH SENSITIVITY)     EKG  ED ECG REPORT I, Waylon Cassis, the attending physician, personally viewed and interpreted this ECG.  Date: 07/01/2024 EKG Time: 1252 Rate: 83 Rhythm: normal sinus rhythm QRS Axis: normal Intervals: normal ST/T Wave abnormalities: normal Narrative Interpretation: no evidence of acute ischemia    RADIOLOGY  Chest x-ray: I independently viewed and interpreted the images; there is a left basilar consolidation with no evidence of edema  CT chest:  IMPRESSION:  1. Interval patchy opacities in the anterior left upper lobe and in  the left lower lobe,  compatible with pneumonia.  2. Small left pleural effusion.  3. Borderline dilated central pulmonary arteries, suggesting  borderline pulmonary arterial hypertension.  4. Minimal calcific coronary artery and aortic atherosclerosis.    Aortic Atherosclerosis (ICD10-I70.0).    PROCEDURES:  Critical Care performed: No  Procedures   MEDICATIONS ORDERED IN ED: Medications  levofloxacin  (LEVAQUIN ) IVPB 750 mg (750 mg Intravenous New Bag/Given 07/01/24 1729)  ondansetron  (ZOFRAN ) injection 4 mg (4 mg Intravenous Given 07/01/24 1601)  sodium chloride  0.9 % bolus 500 mL (0 mLs Intravenous Stopped 07/01/24 1730)  traMADol  (ULTRAM ) tablet 50 mg (50 mg Oral Given 07/01/24 1739)     IMPRESSION / MDM / ASSESSMENT AND PLAN / ED COURSE  I reviewed the triage vital signs and the nursing notes.  78 year old female with PMH as noted above presents with somewhat atypical left-sided chest pain over the last several days with respiratory  symptoms.  On exam she is overall well-appearing.  O2 saturation is in the mid 90s on room air.  Physical exam was otherwise unremarkable for acute findings.  Differential diagnosis includes, but is not limited to, musculoskeletal pain, GERD, ACS, acute bronchitis, viral syndrome, pneumonia.  I have a low suspicion for PE given lack of tachycardia or hypoxia and the gradual onset of the symptoms.  There is no clinical evidence for aortic dissection or other vascular cause.  Chest x-ray shows a left lower lobe infiltrate consistent with the patient's symptoms.  I have ordered a CT for further evaluation.  Her WBC count is elevated.  Troponin is negative.  Given the duration of the symptoms there is no indication for serial troponins.  EKG is nonischemic.  Patient's presentation is most consistent with acute presentation with potential threat to life or bodily function.  ----------------------------------------- 6:09 PM on 07/01/2024 -----------------------------------------  CT chest confirms multi lobar pneumonia and an effusion.  The patient has had some drops in her O2 saturation to the mid 80s when asleep and around 89 to 91% when she is awake.  Therefore she will benefit from inpatient admission for further management.  I consulted the hospitalist; based on our discussion she agrees to evaluate the patient for admission.    FINAL CLINICAL IMPRESSION(S) / ED DIAGNOSES   Final diagnoses:  Community acquired pneumonia of left lung, unspecified part of lung  Pleural effusion     Rx / DC Orders   ED Discharge Orders     None        Note:  This document was prepared using Dragon voice recognition software and may include unintentional dictation errors.    Jacolyn Pae, MD 07/01/24 1810

## 2024-07-01 NOTE — H&P (Signed)
 History and Physical:    [name]   FMW:[id number] DOB: 23-Jul-1946 DOA: 07/01/2024  Referring MD/provider: Dr. Jacolyn PCP: Cleotilde Oneil FALCON, MD   Patient coming from: Home  Chief Complaint: Chest pain, shortness of breath, cough  History of Present Illness:   [name] is an 78 y.o. female with hypothyroidism, anxiety/depression and knee DJD who was in her usual state of health until 3 days ago when she developed left-sided chest pain that she states is constant but has gradually come on and intensified.  It is not sharp, nonpositional but states it hurts when she tries to cough.  She also noted that she was short of breath with exertion and she has developed a new cough that is nonproductive.  No particular increase in chest pain with exertion but she does have increased difficulty catching her breath when exerting herself.  Patient denies fevers or chills although she does admit to some malaise and fatigue.  No nausea vomiting or diarrhea.  ED Course:  The patient was noted to be afebrile and have normal vital signs.  She did not require oxygen on room air at rest.  Laboratory data were notable for leukocytosis, WBC of 16.  EKG without signs of ischemia.  Chest x-ray shows left-sided infiltrate.  CT scan shows LUL and LLL patchy infiltrate consistent with pneumonia.  Patient was given a dose of Levaquin  and plan was to send her home however patient desatted to the mid 80s with minimal exertion.  Patient is now admitted for treatment of community-acquired pneumonia.  ROS:   ROS   Review of Systems: Per HPI  Past Medical History:   Past Medical History:  Diagnosis Date   Arthritis    C. difficile diarrhea    Hypertension    Thyroid  disease     Past Surgical History:   Past Surgical History:  Procedure Laterality Date   TONSILLECTOMY      Social History:   Social History   Socioeconomic History   Marital status: Married    Spouse name: Not on file    Number of children: Not on file   Years of education: Not on file   Highest education level: Not on file  Occupational History   Not on file  Tobacco Use   Smoking status: Every Day    Current packs/day: 1.00    Average packs/day: 1 pack/day for 20.0 years (20.0 ttl pk-yrs)    Types: Cigarettes   Smokeless tobacco: Never  Vaping Use   Vaping status: Never Used  Substance and Sexual Activity   Alcohol use: No   Drug use: Never   Sexual activity: Not on file  Other Topics Concern   Not on file  Social History Narrative   Not on file   Social Drivers of Health   Financial Resource Strain: Low Risk  (05/15/2024)   Received from [facility]   Overall Financial Resource Strain (CARDIA)    Difficulty of Paying Living Expenses: Not hard at all  Food Insecurity: No Food Insecurity (05/15/2024)   Received from [facility]   Hunger Vital Sign    Within the past 12 months, you worried that your food would run out before you got the money to buy more.: Never true    Within the past 12 months, the food you bought just didn't last and you didn't have money to get more.: Never true  Transportation Needs: No Transportation Needs (05/15/2024)  Received from [facility] - Transportation    In the past 12 months, has lack of transportation kept you from medical appointments or from getting medications?: No    Lack of Transportation (Non-Medical): No  Physical Activity: Not on file  Stress: Not on file  Social Connections: Not on file  Intimate Partner Violence: Not on file    Allergies   Ciprofloxacin  Family history:   Family History  Adopted: Yes  Family history unknown: Yes    Current Medications:   Prior to Admission medications   Medication Sig Start Date End Date Taking? Authorizing Provider  atorvastatin  (LIPITOR) 10 MG tablet Take 10 mg by mouth daily.    [provider]  cyanocobalamin (VITAMIN  B12) 1000 MCG/ML injection Inject 100 mcg into the skin every 30 (thirty) days. 05/04/23   [provider]  escitalopram  (LEXAPRO ) 10 MG tablet Take 10 mg by mouth daily. 10/11/23   [provider]  folic acid (FOLVITE) 1 MG tablet Take 1 mg by mouth daily.    [provider]  hydrOXYzine  (ATARAX ) 10 MG tablet Take 10 mg by mouth 3 (three) times daily.    [provider]  levothyroxine  (SYNTHROID ) 150 MCG tablet Take by mouth. 11/19/23 11/18/24  [provider]  levothyroxine  (SYNTHROID ) 200 MCG tablet Take 200 mcg by mouth daily. Patient not taking: Reported on 05/23/2024    [provider]  liothyronine  (CYTOMEL ) 5 MCG tablet Take by mouth. 02/24/16   [provider]  loratadine (CLARITIN) 10 MG tablet Take 10 mg by mouth daily as needed.    [provider]  meclizine  (ANTIVERT ) 25 MG tablet Take 25 mg by mouth 2 (two) times daily as needed. 08/06/23   [provider]  Melatonin 5 MG CAPS Take 5 mg by mouth at bedtime.    [provider]  metoprolol  succinate (TOPROL -XL) 25 MG 24 hr tablet TAKE 1 TABLET(25 MG) BY MOUTH DAILY 06/14/24   Darron Deatrice LABOR, MD  traMADol  (ULTRAM ) 50 MG tablet Take 50 mg by mouth every 6 (six) hours as needed. 10/06/16   [provider]  traZODone (DESYREL) 50 MG tablet Take 50 mg by mouth at bedtime. 05/15/24 05/15/25  [provider]    Physical Exam:   Vitals:   07/01/24 1715 07/01/24 1730 07/01/24 1730 07/01/24 1745  BP:  129/61    Pulse: 80 75  75  Resp:      Temp:   99.6 F (37.6 C)   TempSrc:   Oral   SpO2: (!) 87% 93%  98%  Weight:      Height:         Physical Exam: Blood pressure 129/61, pulse 75, temperature 99.6 F (37.6 C), temperature source Oral, resp. rate (!) 22, height 5' 6 (1.676 m), weight 90.7 kg, SpO2 98%. Gen: Patient lying on her right side holding herself very still with shallow breaths, splinting secondary to pain Eyes: sclera  anicteric, conjuctiva mildly injected bilaterally CVS: S1-S2, regulary, no gallops Respiratory:  decreased air entry likely secondary to decreased inspiratory effort GI: NABS, soft, NT  LE: No edema. No cyanosis Neuro: A/O x 3, Moving all extremities equally with normal strength, CN 3-12 intact, grossly nonfocal.  Psych: patient is logical and coherent, judgement and insight appear normal, mood and affect appropriate to situation. Skin: no rashes or lesions or ulcers,    Data Review:    Labs: Basic Metabolic Panel: Recent Labs  Lab 07/01/24 1255  NA 136  K 4.0  CL 103  CO2 21*  GLUCOSE 119*  BUN 23  CREATININE 0.94  CALCIUM  9.1   Liver Function Tests: No results for input(s): AST, ALT, ALKPHOS, BILITOT, PROT, ALBUMIN in the last 168 hours. No results for input(s): LIPASE, AMYLASE in the last 168 hours. No results for input(s): AMMONIA in the last 168 hours. CBC: Recent Labs  Lab 07/01/24 1255  WBC 16.6*  HGB 14.9  HCT 43.1  MCV 86.5  PLT 197   Cardiac Enzymes: No results for input(s): CKTOTAL, CKMB, CKMBINDEX, TROPONINI in the last 168 hours.  BNP (last 3 results) No results for input(s): PROBNP in the last 8760 hours. CBG: No results for input(s): GLUCAP in the last 168 hours.  Urinalysis    Component Value Date/Time   COLORURINE Yellow 07/16/2013 1121   APPEARANCEUR Hazy 07/16/2013 1121   LABSPEC 1.025 07/16/2013 1121   PHURINE 5.0 07/16/2013 1121   GLUCOSEU Negative 07/16/2013 1121   HGBUR Negative 07/16/2013 1121   BILIRUBINUR Negative 07/16/2013 1121   KETONESUR 2+ 07/16/2013 1121   PROTEINUR 30 mg/dL 91/68/7985 8878   NITRITE Negative 07/16/2013 1121   LEUKOCYTESUR Trace 07/16/2013 1121      Radiographic Studies: CT CHEST WO CONTRAST Result Date: 07/01/2024 CLINICAL DATA:  Left chest pain for the past 2 days with nausea and generalized weakness. Left basilar consolidation involving the lingula and left lower lobe  and suspected small left pleural effusion on chest radiographs earlier today. EXAM: CT CHEST WITHOUT CONTRAST TECHNIQUE: Multidetector CT imaging of the chest was performed following the standard protocol without IV contrast. RADIATION DOSE REDUCTION: This exam was performed according to the departmental dose-optimization program which includes automated exposure control, adjustment of the mA and/or kV according to patient size and/or use of iterative reconstruction technique. COMPARISON:  Chest radiographs obtained earlier today. Cardiac/coronary CTA dated 11/27/2023. FINDINGS: Cardiovascular: Borderline enlarged heart. Minimal atheromatous calcifications, including the coronary arteries and aorta. Borderline dilated central pulmonary arteries with a main pulmonary artery diameter of 2.9 cm. No pericardial effusion. Mediastinum/Nodes: No enlarged mediastinal or axillary lymph nodes. Thyroid  gland, trachea, and esophagus demonstrate no significant findings. Lungs/Pleura: Interval patchy opacities in the anterior left upper lobe and in the left lower lobe. Small left pleural effusion. Clear right lung. Upper Abdomen: 1.8 cm lateral segment left lobe liver hemangioma or mildly complicated cyst. 6.0 cm simple appearing, partially included right renal cyst. This does not need imaging follow-up. Musculoskeletal: Elevated left hemidiaphragm. Thoracic spine degenerative changes and mild scoliosis. IMPRESSION: 1. Interval patchy opacities in the anterior left upper lobe and in the left lower lobe, compatible with pneumonia. 2. Small left pleural effusion. 3. Borderline dilated central pulmonary arteries, suggesting borderline pulmonary arterial hypertension. 4. Minimal calcific coronary artery and aortic atherosclerosis. Aortic Atherosclerosis (ICD10-I70.0). Electronically Signed   By: Elspeth Bathe M.D.   On: 07/01/2024 16:06   DG Chest 2 View Result Date: 07/01/2024 CLINICAL DATA:  Left-sided chest pain for 2 days,  nausea, generalized weakness EXAM: CHEST - 2 VIEW COMPARISON:  07/16/2013 FINDINGS: Frontal and lateral views of the chest demonstrate an unremarkable cardiac silhouette. There is consolidation at the left lung base, obscuring portions of the cardiac apex silhouette and left hemidiaphragm, consistent with lingular and left lower lobe consolidation. Small left effusion cannot be excluded. The right chest is clear. No pneumothorax. No acute bony abnormalities. IMPRESSION: 1. Left basilar consolidation involving the lingula and left lower lobe, which may reflect atelectasis  or airspace disease. 2. Small left pleural effusion is suspected. Electronically Signed   By: Ozell Daring M.D.   On: 07/01/2024 13:37    EKG: Independently reviewed.    Assessment/Plan:   Principal Problem:   CAP (community acquired pneumonia)   CAP Leukocytosis Chest CT with patchy opacities in LUL and LLL and small left pleural effusion Patient received Levaquin  in the ED Will switch to ceftriaxone  and azithromycin  given history of C. difficile to ciprofloxacin in the past  Pleuritic chest pain Splinting Patient is holding her self very still/taking shallow breaths  Would very much like to avoid ongoing splinting Will treat with Toradol  15 mg IV x 1 then as needed for 5 doses. Will order as needed as needed morphine  if Toradol  itself is ineffective. She will need to be able to sit up and take deep breaths to improve ventilation   HTN Restart medications once meds are reconciled  Hypothyroidism Restart thyroid  medications once medications are reconciled  Anxiety/depression Restart medications once medications are reconciled     Other information:   DVT prophylaxis: Lovenox  ordered. Code Status: Full Family Communication: None today Disposition Plan: Home Consults called: None Admission status: Observation  Apolonio Cutting Tublu Kyshaun Barnette [facility]  If 7PM-7AM, please contact  night-coverage [email or web address]

## 2024-07-01 NOTE — ED Notes (Signed)
 Pts oxygen sat dropped to 86% on RA. Dr. Jacolyn notified. 2L [place] applied to pt and pts oxygen sat increased to 94%.

## 2024-07-01 NOTE — ED Triage Notes (Signed)
 Arrived by [facility] from home with c/o left sided chest pain  X2 days. Radiation to rib cage. Reports nausea and generalized weakness.   EMS administered: 324 aspirin  EMS vitals: 150/90 b/p 76HR 98.8oral 20RR

## 2024-07-02 ENCOUNTER — Inpatient Hospital Stay

## 2024-07-02 DIAGNOSIS — I428 Other cardiomyopathies: Secondary | ICD-10-CM

## 2024-07-02 DIAGNOSIS — I82441 Acute embolism and thrombosis of right tibial vein: Secondary | ICD-10-CM | POA: Diagnosis present

## 2024-07-02 DIAGNOSIS — I110 Hypertensive heart disease with heart failure: Secondary | ICD-10-CM | POA: Diagnosis present

## 2024-07-02 DIAGNOSIS — I5022 Chronic systolic (congestive) heart failure: Secondary | ICD-10-CM | POA: Insufficient documentation

## 2024-07-02 DIAGNOSIS — F419 Anxiety disorder, unspecified: Secondary | ICD-10-CM | POA: Diagnosis present

## 2024-07-02 DIAGNOSIS — I82411 Acute embolism and thrombosis of right femoral vein: Secondary | ICD-10-CM | POA: Diagnosis present

## 2024-07-02 DIAGNOSIS — D649 Anemia, unspecified: Secondary | ICD-10-CM | POA: Diagnosis present

## 2024-07-02 DIAGNOSIS — I2609 Other pulmonary embolism with acute cor pulmonale: Secondary | ICD-10-CM | POA: Diagnosis present

## 2024-07-02 DIAGNOSIS — I50810 Right heart failure, unspecified: Secondary | ICD-10-CM | POA: Diagnosis not present

## 2024-07-02 DIAGNOSIS — F32A Depression, unspecified: Secondary | ICD-10-CM | POA: Diagnosis present

## 2024-07-02 DIAGNOSIS — Z881 Allergy status to other antibiotic agents status: Secondary | ICD-10-CM | POA: Diagnosis not present

## 2024-07-02 DIAGNOSIS — Z79899 Other long term (current) drug therapy: Secondary | ICD-10-CM

## 2024-07-02 DIAGNOSIS — I82431 Acute embolism and thrombosis of right popliteal vein: Secondary | ICD-10-CM | POA: Diagnosis present

## 2024-07-02 DIAGNOSIS — E039 Hypothyroidism, unspecified: Secondary | ICD-10-CM | POA: Diagnosis present

## 2024-07-02 DIAGNOSIS — I2510 Atherosclerotic heart disease of native coronary artery without angina pectoris: Secondary | ICD-10-CM | POA: Diagnosis present

## 2024-07-02 DIAGNOSIS — Z792 Long term (current) use of antibiotics: Secondary | ICD-10-CM

## 2024-07-02 DIAGNOSIS — J90 Pleural effusion, not elsewhere classified: Secondary | ICD-10-CM | POA: Diagnosis present

## 2024-07-02 DIAGNOSIS — J9601 Acute respiratory failure with hypoxia: Secondary | ICD-10-CM | POA: Diagnosis present

## 2024-07-02 DIAGNOSIS — R079 Chest pain, unspecified: Secondary | ICD-10-CM | POA: Diagnosis not present

## 2024-07-02 DIAGNOSIS — E785 Hyperlipidemia, unspecified: Secondary | ICD-10-CM | POA: Diagnosis present

## 2024-07-02 DIAGNOSIS — Z1152 Encounter for screening for COVID-19: Secondary | ICD-10-CM | POA: Diagnosis not present

## 2024-07-02 DIAGNOSIS — L400 Psoriasis vulgaris: Secondary | ICD-10-CM | POA: Diagnosis present

## 2024-07-02 DIAGNOSIS — J189 Pneumonia, unspecified organism: Secondary | ICD-10-CM | POA: Diagnosis present

## 2024-07-02 DIAGNOSIS — Z79890 Hormone replacement therapy (postmenopausal): Secondary | ICD-10-CM | POA: Diagnosis not present

## 2024-07-02 DIAGNOSIS — I82451 Acute embolism and thrombosis of right peroneal vein: Secondary | ICD-10-CM | POA: Diagnosis present

## 2024-07-02 DIAGNOSIS — I2602 Saddle embolus of pulmonary artery with acute cor pulmonale: Secondary | ICD-10-CM | POA: Diagnosis not present

## 2024-07-02 DIAGNOSIS — Z9889 Other specified postprocedural states: Secondary | ICD-10-CM | POA: Diagnosis not present

## 2024-07-02 DIAGNOSIS — G8929 Other chronic pain: Secondary | ICD-10-CM | POA: Diagnosis present

## 2024-07-02 DIAGNOSIS — I2699 Other pulmonary embolism without acute cor pulmonale: Secondary | ICD-10-CM

## 2024-07-02 DIAGNOSIS — Z7901 Long term (current) use of anticoagulants: Secondary | ICD-10-CM | POA: Diagnosis not present

## 2024-07-02 DIAGNOSIS — F17210 Nicotine dependence, cigarettes, uncomplicated: Secondary | ICD-10-CM | POA: Diagnosis present

## 2024-07-02 LAB — CBC
HCT: 40.5 % (ref 36.0–46.0)
Hemoglobin: 13.6 g/dL (ref 12.0–15.0)
MCH: 30 pg (ref 26.0–34.0)
MCHC: 33.6 g/dL (ref 30.0–36.0)
MCV: 89.4 fL (ref 80.0–100.0)
Platelets: 183 K/uL (ref 150–400)
RBC: 4.53 MIL/uL (ref 3.87–5.11)
RDW: 12.5 % (ref 11.5–15.5)
WBC: 14.7 K/uL — ABNORMAL HIGH (ref 4.0–10.5)
nRBC: 0 % (ref 0.0–0.2)

## 2024-07-02 LAB — RESPIRATORY PANEL BY PCR

## 2024-07-02 LAB — PROCALCITONIN: Procalcitonin: 0.24 ng/mL

## 2024-07-02 LAB — D-DIMER, QUANTITATIVE: D-Dimer, Quant: 3.53 ug{FEU}/mL — ABNORMAL HIGH (ref 0.00–0.50)

## 2024-07-02 LAB — MRSA NEXT GEN BY PCR, NASAL: MRSA by PCR Next Gen: NOT DETECTED

## 2024-07-02 LAB — SARS CORONAVIRUS 2 BY RT PCR: SARS Coronavirus 2 by RT PCR: NEGATIVE

## 2024-07-02 LAB — GLUCOSE, CAPILLARY: Glucose-Capillary: 122 mg/dL — ABNORMAL HIGH (ref 70–99)

## 2024-07-02 LAB — STREP PNEUMONIAE URINARY ANTIGEN: Strep Pneumo Urinary Antigen: NEGATIVE

## 2024-07-02 LAB — HIV ANTIBODY (ROUTINE TESTING W REFLEX): HIV Screen 4th Generation wRfx: NONREACTIVE

## 2024-07-02 LAB — TROPONIN I (HIGH SENSITIVITY): Troponin I (High Sensitivity): 10 ng/L (ref <18)

## 2024-07-02 MED ORDER — ESCITALOPRAM OXALATE 10 MG PO TABS
10.0000 mg | ORAL_TABLET | Freq: Every day | ORAL | Status: DC
  Administered 2024-07-02 – 2024-07-06 (×5): 10 mg via ORAL
  Filled 2024-07-02 (×6): qty 1

## 2024-07-02 MED ORDER — HEPARIN BOLUS VIA INFUSION
5000.0000 [IU] | Freq: Once | INTRAVENOUS | Status: AC
  Administered 2024-07-02: 5000 [IU] via INTRAVENOUS
  Filled 2024-07-02: qty 5000

## 2024-07-02 MED ORDER — METOPROLOL SUCCINATE ER 25 MG PO TB24
25.0000 mg | ORAL_TABLET | Freq: Every day | ORAL | Status: DC
  Administered 2024-07-02 – 2024-07-06 (×5): 25 mg via ORAL
  Filled 2024-07-02 (×5): qty 1

## 2024-07-02 MED ORDER — ONDANSETRON 4 MG PO TBDP
4.0000 mg | ORAL_TABLET | Freq: Four times a day (QID) | ORAL | Status: DC | PRN
  Administered 2024-07-02: 4 mg via ORAL
  Filled 2024-07-02: qty 1

## 2024-07-02 MED ORDER — ATORVASTATIN CALCIUM 10 MG PO TABS
10.0000 mg | ORAL_TABLET | Freq: Every day | ORAL | Status: DC
  Administered 2024-07-02 – 2024-07-06 (×5): 10 mg via ORAL
  Filled 2024-07-02 (×5): qty 1

## 2024-07-02 MED ORDER — HEPARIN (PORCINE) 25000 UT/250ML-% IV SOLN
1450.0000 [IU]/h | INTRAVENOUS | Status: DC
  Administered 2024-07-02 – 2024-07-06 (×5): 1300 [IU]/h via INTRAVENOUS
  Filled 2024-07-02 (×5): qty 250

## 2024-07-02 MED ORDER — HYDROXYZINE HCL 10 MG PO TABS
10.0000 mg | ORAL_TABLET | Freq: Three times a day (TID) | ORAL | Status: DC | PRN
  Administered 2024-07-04 – 2024-07-06 (×3): 10 mg via ORAL
  Filled 2024-07-02 (×6): qty 1

## 2024-07-02 MED ORDER — CHLORHEXIDINE GLUCONATE CLOTH 2 % EX PADS
6.0000 | MEDICATED_PAD | Freq: Every day | CUTANEOUS | Status: DC
  Administered 2024-07-02 – 2024-07-06 (×4): 6 via TOPICAL

## 2024-07-02 MED ORDER — IOHEXOL 350 MG/ML SOLN
75.0000 mL | Freq: Once | INTRAVENOUS | Status: AC | PRN
  Administered 2024-07-02: 75 mL via INTRAVENOUS

## 2024-07-02 MED ORDER — SODIUM CHLORIDE 0.9 % IV SOLN
1.0000 g | INTRAVENOUS | Status: DC
  Administered 2024-07-02 – 2024-07-03 (×2): 1 g via INTRAVENOUS
  Filled 2024-07-02 (×2): qty 10

## 2024-07-02 MED ORDER — LEVOTHYROXINE SODIUM 50 MCG PO TABS
150.0000 ug | ORAL_TABLET | Freq: Every day | ORAL | Status: DC
  Administered 2024-07-02 – 2024-07-06 (×3): 150 ug via ORAL
  Filled 2024-07-02: qty 1
  Filled 2024-07-02: qty 3
  Filled 2024-07-02 (×2): qty 1

## 2024-07-02 MED ORDER — POLYETHYLENE GLYCOL 3350 17 G PO PACK
17.0000 g | PACK | Freq: Every day | ORAL | Status: DC
  Filled 2024-07-02 (×3): qty 1

## 2024-07-02 MED ORDER — AZITHROMYCIN 250 MG PO TABS
500.0000 mg | ORAL_TABLET | Freq: Every day | ORAL | Status: DC
  Administered 2024-07-02 – 2024-07-03 (×2): 500 mg via ORAL
  Filled 2024-07-02: qty 1
  Filled 2024-07-02: qty 2

## 2024-07-02 MED ORDER — LIOTHYRONINE SODIUM 25 MCG PO TABS
25.0000 ug | ORAL_TABLET | Freq: Every day | ORAL | Status: DC
  Administered 2024-07-02 – 2024-07-06 (×5): 25 ug via ORAL
  Filled 2024-07-02 (×6): qty 1

## 2024-07-02 NOTE — Consult Note (Addendum)
 [facility] Vascular Consult Note  MRN : [id number]  [name] is a 78 y.o. (03-May-1946) female who presents with chief complaint of  Chief Complaint  Patient presents with   Chest Pain  .  History of Present Illness: 78 year old female past medical history of hypertension and tobacco use presented with approximately 3 to 4 days of worsening shortness of breath left-sided chest pain and cough.  She was admitted and being treated for acute community acquired pneumonia yesterday.  Continued evaluation obtained a CT PE protocol this evening which revealed bilateral submassive pulmonary emboli left greater than right with right heart strain.  Patient has been hemodynamically stable without tachycardia saturating 92 to 97% on 2 to 3 L nasal cannula.  She states she does have some left sided chest discomfort but is otherwise without complaint.  She denies lower extremity pain or swelling.  Current Facility-Administered Medications  Medication Dose Route Frequency Provider Last Rate Last Admin   atorvastatin  (LIPITOR) tablet 10 mg  10 mg Oral Daily Wouk, Devaughn Sayres, MD   10 mg at 07/02/24 1322   azithromycin  (ZITHROMAX ) tablet 500 mg  500 mg Oral Daily Kandis Devaughn Sayres, MD   500 mg at 07/02/24 1811   cefTRIAXone  (ROCEPHIN ) 1 g in sodium chloride  0.9 % 100 mL IVPB  1 g Intravenous Q24H Wouk, Devaughn Sayres, MD       Chlorhexidine  Gluconate Cloth 2 % PADS 6 each  6 each Topical Daily Wouk, Devaughn Sayres, MD       escitalopram  (LEXAPRO ) tablet 10 mg  10 mg Oral Daily Kandis Devaughn Sayres, MD   10 mg at 07/02/24 1322   heparin  ADULT infusion 100 units/mL (25000 units/250mL)  1,300 Units/hr Intravenous Continuous Wouk, Devaughn Sayres, MD       heparin  bolus via infusion 5,000 Units  5,000 Units Intravenous Once Wouk, Devaughn Sayres, MD       hydrOXYzine  (ATARAX ) tablet 10 mg  10 mg Oral TID PRN Kandis Devaughn Sayres, MD       levothyroxine  (SYNTHROID ) tablet 150 mcg  150 mcg Oral  Q0600 Kandis Devaughn Sayres, MD   150 mcg at 07/02/24 1322   liothyronine  (CYTOMEL ) tablet 25 mcg  25 mcg Oral Daily Kandis Devaughn Sayres, MD   25 mcg at 07/02/24 1619   metoprolol  succinate (TOPROL -XL) 24 hr tablet 25 mg  25 mg Oral Daily Kandis Devaughn Sayres, MD   25 mg at 07/02/24 1322   morphine  (PF) 2 MG/ML injection 1 mg  1 mg Intravenous Q3H PRN Chatterjee, Srobona Tublu, MD       ondansetron  (ZOFRAN -ODT) disintegrating tablet 4 mg  4 mg Oral Q6H PRN Kandis Devaughn Sayres, MD   4 mg at 07/02/24 1811   polyethylene glycol (MIRALAX  / GLYCOLAX ) packet 17 g  17 g Oral Daily Wouk, Devaughn Sayres, MD        Past Medical History:  Diagnosis Date   Arthritis    C. difficile diarrhea    Hypertension    Thyroid  disease     Past Surgical History:  Procedure Laterality Date   TONSILLECTOMY      Social History Social History   Tobacco Use   Smoking status: Every Day    Current packs/day: 1.00    Average packs/day: 1 pack/day for 20.0 years (20.0 ttl pk-yrs)    Types: Cigarettes   Smokeless tobacco: Never  Vaping Use   Vaping status: Never Used  Substance Use Topics   Alcohol use:  No   Drug use: Never    Family History Family History  Adopted: Yes  Family history unknown: Yes    Allergies  Allergen Reactions   Ciprofloxacin Other (See Comments)    Caused cdiff.   C-Diff     REVIEW OF SYSTEMS (Negative unless checked)  Constitutional: [] Weight loss  [] Fever  [] Chills Cardiac: [x] Chest pain   [] Chest pressure   [] Palpitations   [] Shortness of breath when laying flat   [x] Shortness of breath at rest   [] Shortness of breath with exertion. Vascular:  [] Pain in legs with walking   [] Pain in legs at rest   [] Pain in legs when laying flat   [] Claudication   [] Pain in feet when walking  [] Pain in feet at rest  [] Pain in feet when laying flat   [] History of DVT   [] Phlebitis   [] Swelling in legs   [] Varicose veins   [] Non-healing ulcers Pulmonary:   [] Uses home oxygen   [] Productive cough    [] Hemoptysis   [] Wheeze  [] COPD   [] Asthma Neurologic:  [] Dizziness  [] Blackouts   [] Seizures   [] History of stroke   [] History of TIA  [] Aphasia   [] Temporary blindness   [] Dysphagia   [] Weakness or numbness in arms   [] Weakness or numbness in legs Musculoskeletal:  [] Arthritis   [] Joint swelling   [x] Joint pain   [] Low back pain Hematologic:  [] Easy bruising  [] Easy bleeding   [] Hypercoagulable state   [] Anemic  [] Hepatitis Gastrointestinal:  [] Blood in stool   [] Vomiting blood  [] Gastroesophageal reflux/heartburn   [] Difficulty swallowing. Genitourinary:  [] Chronic kidney disease   [] Difficult urination  [] Frequent urination  [] Burning with urination   [] Blood in urine Skin:  [] Rashes   [] Ulcers   [] Wounds Psychological:  [] History of anxiety   []  History of major depression.  Physical Examination  Vitals:   07/02/24 1553 07/02/24 1730 07/02/24 1732 07/02/24 1927  BP: (!) 113/49  137/77 (!) 121/90  Pulse: 74  73 83  Resp: 16  (!) 23   Temp: 98.4 F (36.9 C)  99.5 F (37.5 C) 100.3 F (37.9 C)  TempSrc: Oral  Oral Oral  SpO2: 91% (!) 86% 92% 92%  Weight:      Height:       Body mass index is 29.42 kg/m. Gen:  WD/WN, NAD Head: [place]/AT, No temporalis wasting. Prominent temp pulse not noted. Pulmonary:  Good air movement, respirations not labored,  Cardiac: RRR, normal S1, S2. Vascular:  Vessel Right Left  Radial Palpable Palpable  Ulnar    Brachial    Carotid Palpable, without bruit Palpable, without bruit  Aorta Not palpable N/A  Femoral Palpable Palpable  Popliteal    PT    DP     Gastrointestinal: soft, non-tender/non-distended. No guarding/reflex.  Musculoskeletal: M/S 5/5 throughout.  Extremities without ischemic changes.  No deformity or atrophy. No edema. Neurologic: Sensation grossly intact in extremities.  Symmetrical.  Speech is fluent. Motor exam as listed above. Psychiatric: Judgment intact, Mood & affect appropriate for pt's clinical  situation.      CBC Lab Results  Component Value Date   WBC 14.7 (H) 07/02/2024   HGB 13.6 07/02/2024   HCT 40.5 07/02/2024   MCV 89.4 07/02/2024   PLT 183 07/02/2024    BMET    Component Value Date/Time   NA 136 07/01/2024 1255   NA 146 (H) 07/17/2013 0413   K 4.0 07/01/2024 1255   K 3.5 07/17/2013 0413   CL 103 07/01/2024  1255   CL 116 (H) 07/17/2013 0413   CO2 21 (L) 07/01/2024 1255   CO2 25 07/17/2013 0413   GLUCOSE 119 (H) 07/01/2024 1255   GLUCOSE 89 07/17/2013 0413   BUN 23 07/01/2024 1255   BUN 11 07/17/2013 0413   CREATININE 0.94 07/01/2024 1255   CREATININE 1.02 07/17/2013 0413   CALCIUM  9.1 07/01/2024 1255   CALCIUM  8.0 (L) 07/17/2013 0413   GFRNONAA >60 07/01/2024 1255   GFRNONAA 58 (L) 07/17/2013 0413   GFRAA >60 07/17/2013 0413   Estimated Creatinine Clearance: 55.2 mL/min (by C-G formula based on SCr of 0.94 mg/dL).  COAG No results found for: INR, PROTIME  Radiology CT Angio Chest Pulmonary Embolism (PE) W or WO Contrast Addendum Date: 07/02/2024 ADDENDUM REPORT: 07/02/2024 19:40 ADDENDUM: These results were called by telephone at the time of interpretation on 07/02/2024 at 7:39 pm to provider Dr. Cleatus, Who verbally acknowledged these results. Electronically Signed   By: Franky Crease M.D.   On: 07/02/2024 19:40   Result Date: 07/02/2024 CLINICAL DATA:  pleuritic chest pain, hypoxia, elevated dimer EXAM: CT ANGIOGRAPHY CHEST WITH CONTRAST TECHNIQUE: Multidetector CT imaging of the chest was performed using the standard protocol during bolus administration of intravenous contrast. Multiplanar CT image reconstructions and MIPs were obtained to evaluate the vascular anatomy. RADIATION DOSE REDUCTION: This exam was performed according to the departmental dose-optimization program which includes automated exposure control, adjustment of the mA and/or kV according to patient size and/or use of iterative reconstruction technique. CONTRAST:  75mL OMNIPAQUE   IOHEXOL  350 MG/ML SOLN COMPARISON:  07/01/2024 chest x-ray and noncontrast CT. FINDINGS: Cardiovascular: Filling defects within upper and lower lobe pulmonary dural branches on the left as well as right lower lobe pulmonary arterial branches compatible with pulmonary emboli. Elevated RV/LV ratio at 1.09 compatible with right heart strain. Heart is borderline in size. Scattered aortic atherosclerosis. Aorta normal caliber. Mediastinum/Nodes: No mediastinal, hilar, or axillary adenopathy. Trachea and esophagus are unremarkable. Thyroid  unremarkable. Lungs/Pleura: Small left pleural effusion. Airspace disease in the left upper lobe and left lower lobe could reflect pneumonia or infarcts. No confluent opacity or effusion on the right. Upper Abdomen: No acute findings Musculoskeletal: Chest wall soft tissues are unremarkable. No acute bony abnormality. Review of the MIP images confirms the above findings. IMPRESSION: Bilateral pulmonary emboli, left greater than right. CT evidence of right heart strain (RV/LV Ratio = 1.09) consistent with at least submassive (intermediate risk) PE. The presence of right heart strain has been associated with an increased risk of morbidity and mortality. Please refer to the Code PE Focused order set in EPIC. Small left pleural effusion. Airspace disease in the left upper lobe and left lower lobe could reflect pulmonary infarct or pneumonia. Electronically Signed: By: Franky Crease M.D. On: 07/02/2024 19:17   CT CHEST WO CONTRAST Result Date: 07/01/2024 CLINICAL DATA:  Left chest pain for the past 2 days with nausea and generalized weakness. Left basilar consolidation involving the lingula and left lower lobe and suspected small left pleural effusion on chest radiographs earlier today. EXAM: CT CHEST WITHOUT CONTRAST TECHNIQUE: Multidetector CT imaging of the chest was performed following the standard protocol without IV contrast. RADIATION DOSE REDUCTION: This exam was performed  according to the departmental dose-optimization program which includes automated exposure control, adjustment of the mA and/or kV according to patient size and/or use of iterative reconstruction technique. COMPARISON:  Chest radiographs obtained earlier today. Cardiac/coronary CTA dated 11/27/2023. FINDINGS: Cardiovascular: Borderline enlarged heart. Minimal atheromatous calcifications, including  the coronary arteries and aorta. Borderline dilated central pulmonary arteries with a main pulmonary artery diameter of 2.9 cm. No pericardial effusion. Mediastinum/Nodes: No enlarged mediastinal or axillary lymph nodes. Thyroid  gland, trachea, and esophagus demonstrate no significant findings. Lungs/Pleura: Interval patchy opacities in the anterior left upper lobe and in the left lower lobe. Small left pleural effusion. Clear right lung. Upper Abdomen: 1.8 cm lateral segment left lobe liver hemangioma or mildly complicated cyst. 6.0 cm simple appearing, partially included right renal cyst. This does not need imaging follow-up. Musculoskeletal: Elevated left hemidiaphragm. Thoracic spine degenerative changes and mild scoliosis. IMPRESSION: 1. Interval patchy opacities in the anterior left upper lobe and in the left lower lobe, compatible with pneumonia. 2. Small left pleural effusion. 3. Borderline dilated central pulmonary arteries, suggesting borderline pulmonary arterial hypertension. 4. Minimal calcific coronary artery and aortic atherosclerosis. Aortic Atherosclerosis (ICD10-I70.0). Electronically Signed   By: Elspeth Bathe M.D.   On: 07/01/2024 16:06   DG Chest 2 View Result Date: 07/01/2024 CLINICAL DATA:  Left-sided chest pain for 2 days, nausea, generalized weakness EXAM: CHEST - 2 VIEW COMPARISON:  07/16/2013 FINDINGS: Frontal and lateral views of the chest demonstrate an unremarkable cardiac silhouette. There is consolidation at the left lung base, obscuring portions of the cardiac apex silhouette and left  hemidiaphragm, consistent with lingular and left lower lobe consolidation. Small left effusion cannot be excluded. The right chest is clear. No pneumothorax. No acute bony abnormalities. IMPRESSION: 1. Left basilar consolidation involving the lingula and left lower lobe, which may reflect atelectasis or airspace disease. 2. Small left pleural effusion is suspected. Electronically Signed   By: Ozell Daring M.D.   On: 07/01/2024 13:37      Assessment/Plan 1.  Bilateral submassive pulmonary emboli left greater than right with right heart strain. Begin heparin  drip, transferred to telemetry or stepdown for more monitored care. Will plan for pulmonary thrombectomy tomorrow.  Extensive discussion with the patient and her family at bedside.  N.p.o. after midnight. Obtain lower extremity venous US - eval for DVT 2.  Community acquired pneumonia-continue antibiotics and supportive care.   Tisa Curry LABOR, MD  07/02/2024 8:05 PM    This note was created with Dragon medical transcription system.  Any error is purely unintentional

## 2024-07-02 NOTE — Plan of Care (Signed)

## 2024-07-02 NOTE — Progress Notes (Signed)
 PHARMACY - ANTICOAGULATION CONSULT NOTE  Pharmacy Consult for Heparin  Indication: pulmonary embolus  Allergies  Allergen Reactions   Ciprofloxacin Other (See Comments)    Caused cdiff.   C-Diff    Patient Measurements: Height: 5' 6 (167.6 cm) Weight: 82.7 kg (182 lb 4.8 oz) IBW/kg (Calculated) : 59.3 HEPARIN  DW (KG): 76.7  Vital Signs: Temp: 100.3 F (37.9 C) (08/17 1927) Temp Source: Oral (08/17 1927) BP: 121/90 (08/17 1927) Pulse Rate: 83 (08/17 1927)  Labs: Recent Labs    07/01/24 1255 07/02/24 0448 07/02/24 1145  HGB 14.9 13.6  --   HCT 43.1 40.5  --   PLT 197 183  --   CREATININE 0.94  --   --   TROPONINIHS 12  --  10    Estimated Creatinine Clearance: 55.2 mL/min (by C-G formula based on SCr of 0.94 mg/dL).   Medical History: Past Medical History:  Diagnosis Date   Arthritis    C. difficile diarrhea    Hypertension    Thyroid  disease     Medications:  Scheduled:   atorvastatin   10 mg Oral Daily   azithromycin   500 mg Oral Daily   escitalopram   10 mg Oral Daily   levothyroxine   150 mcg Oral Q0600   liothyronine   25 mcg Oral Daily   metoprolol  succinate  25 mg Oral Daily   polyethylene glycol  17 g Oral Daily   Infusions:   cefTRIAXone  (ROCEPHIN )  IV     PRN: hydrOXYzine , morphine  injection, ondansetron   Assessment: 78 yo female presenting with bilateral pulmonary emboli, left greater than right. No history of PTA anticoagulation noted. Baseline CBC stable. Pharmacy consulted for heparin  dosing.   Goal of Therapy:  Heparin  level 0.3-0.7 units/ml Monitor platelets by anticoagulation protocol: Yes   Plan:  IV heparin  bolus 5000 units x1, followed by heparin  infusion at 1300 units/hr. Check ~8hr heparin  level.  Monitor CBC and for signs/symptoms of bleeding.  Alan Hoe, PharmD 07/02/2024 7:41 PM

## 2024-07-02 NOTE — Progress Notes (Addendum)
 PROGRESS NOTE    [name]  FMW:[id number] DOB: 08/03/1946 DOA: 07/01/2024 PCP: Cleotilde Oneil FALCON, MD     Brief Narrative:   [name] is an 78 y.o. female with hypothyroidism, anxiety/depression and knee DJD who was in her usual state of health until 3 days ago when she developed left-sided chest pain that she states is constant but has gradually come on and intensified.  It is not sharp, nonpositional but states it hurts when she tries to cough.  She also noted that she was short of breath with exertion and she has developed a new cough that is nonproductive.  No particular increase in chest pain with exertion but she does have increased difficulty catching her breath when exerting herself.  Patient denies fevers or chills although she does admit to some malaise and fatigue.  No nausea vomiting or diarrhea.     Assessment & Plan:   Principal Problem:   CAP (community acquired pneumonia) Active Problems:   Acquired hypothyroidism   Benign essential hypertension   Plaque psoriasis   Chronic systolic CHF (congestive heart failure) (HCC)   Nonischemic cardiomyopathy (HCC)  # CAP Several days pleuritic left chest pain. Leukocytosis here, non-con CT with left lung infiltrates - continue ceftriaxone /azithromycin  - f/u urine antigens - blood cultures weren't sent - f/u urine antigens - sputum for culture if able to produce - no covid or other respiratory testing ordered, I've ordered that - check 2nd troponin (first was neg) - check dimer - as pleuritic chest pain is her main symptom, need to consider PE  # Acute hypoxic respiratory failure 2/2 above. 80s in the ER resolved w/ 2 liters - [place] O2, wean as able  # HFrEF Doesn't appear to be acutely exacerbated. No pulm edema noted on CT - f/u bnp - resume home metop  # CAD Non-obstructive - cont home statin  # GAD - resume home lexapro   # Hypothyroid - resume home synthroid , cytomel   # HTN Normotensive in setting  of infection - home losart/hydrochlorothiazide on hold - resume home metop  # Chronic pain - home tramadol   # Debility Ambulates w/ a walker at baseline 2/2 severe knee OA  # History c diff Distant hx of this, with ciprofloxacin - monitor while on abx   DVT prophylaxis: lovenox  Code Status: full Family Communication: none at bedside  Level of care: Med-Surg Status is: Observation    Consultants:  none  Procedures: none  Antimicrobials:  none    Subjective: Stable pleuritic left chest pain  Objective: Vitals:   07/01/24 1830 07/01/24 1920 07/02/24 0424 07/02/24 0740  BP: 122/62 (!) 124/54 110/61 (!) 118/59  Pulse: 81 77 70 68  Resp: (!) 21 17 17 17   Temp:  98.3 F (36.8 C) 99.1 F (37.3 C) 98.3 F (36.8 C)  TempSrc:  Oral  Oral  SpO2: 94% 97% 95% 97%  Weight:  82.7 kg    Height:  5' 6 (1.676 m)      Intake/Output Summary (Last 24 hours) at 07/02/2024 1125 Last data filed at 07/02/2024 0400 Gross per 24 hour  Intake 1312.71 ml  Output 800 ml  Net 512.71 ml   Filed Weights   07/01/24 1251 07/01/24 1920  Weight: 90.7 kg 82.7 kg    Examination:  General exam: Appears calm and comfortable  Respiratory system: Clear to auscultation. Respiratory effort normal. Cardiovascular system: S1 & S2 heard, RRR.   Gastrointestinal system: Abdomen is nondistended, soft and nontender. No organomegaly  or masses felt. Normal bowel sounds heard. Central nervous system: Alert and oriented. No focal neurological deficits. Extremities: Symmetric 5 x 5 power. Skin: No rashes, lesions or ulcers Psychiatry: Judgement and insight appear normal. Mood & affect appropriate.     Data Reviewed: I have personally reviewed following labs and imaging studies  CBC: Recent Labs  Lab 07/01/24 1255 07/02/24 0448  WBC 16.6* 14.7*  HGB 14.9 13.6  HCT 43.1 40.5  MCV 86.5 89.4  PLT 197 183   Basic Metabolic Panel: Recent Labs  Lab 07/01/24 1255  NA 136  K 4.0  CL 103   CO2 21*  GLUCOSE 119*  BUN 23  CREATININE 0.94  CALCIUM  9.1   GFR: Estimated Creatinine Clearance: 55.2 mL/min (by C-G formula based on SCr of 0.94 mg/dL). Liver Function Tests: No results for input(s): AST, ALT, ALKPHOS, BILITOT, PROT, ALBUMIN in the last 168 hours. No results for input(s): LIPASE, AMYLASE in the last 168 hours. No results for input(s): AMMONIA in the last 168 hours. Coagulation Profile: No results for input(s): INR, PROTIME in the last 168 hours. Cardiac Enzymes: No results for input(s): CKTOTAL, CKMB, CKMBINDEX, TROPONINI in the last 168 hours. BNP (last 3 results) No results for input(s): PROBNP in the last 8760 hours. HbA1C: No results for input(s): HGBA1C in the last 72 hours. CBG: No results for input(s): GLUCAP in the last 168 hours. Lipid Profile: No results for input(s): CHOL, HDL, LDLCALC, TRIG, CHOLHDL, LDLDIRECT in the last 72 hours. Thyroid  Function Tests: No results for input(s): TSH, T4TOTAL, FREET4, T3FREE, THYROIDAB in the last 72 hours. Anemia Panel: No results for input(s): VITAMINB12, FOLATE, FERRITIN, TIBC, IRON, RETICCTPCT in the last 72 hours. Urine analysis:    Component Value Date/Time   COLORURINE Yellow 07/16/2013 1121   APPEARANCEUR Hazy 07/16/2013 1121   LABSPEC 1.025 07/16/2013 1121   PHURINE 5.0 07/16/2013 1121   GLUCOSEU Negative 07/16/2013 1121   HGBUR Negative 07/16/2013 1121   BILIRUBINUR Negative 07/16/2013 1121   KETONESUR 2+ 07/16/2013 1121   PROTEINUR 30 mg/dL 91/68/7985 8878   NITRITE Negative 07/16/2013 1121   LEUKOCYTESUR Trace 07/16/2013 1121   Sepsis Labs: @LABRCNTIP (procalcitonin:4,lacticidven:4)  )No results found for this or any previous visit (from the past 240 hours).       Radiology Studies: CT CHEST WO CONTRAST Result Date: 07/01/2024 CLINICAL DATA:  Left chest pain for the past 2 days with nausea and generalized weakness.  Left basilar consolidation involving the lingula and left lower lobe and suspected small left pleural effusion on chest radiographs earlier today. EXAM: CT CHEST WITHOUT CONTRAST TECHNIQUE: Multidetector CT imaging of the chest was performed following the standard protocol without IV contrast. RADIATION DOSE REDUCTION: This exam was performed according to the departmental dose-optimization program which includes automated exposure control, adjustment of the mA and/or kV according to patient size and/or use of iterative reconstruction technique. COMPARISON:  Chest radiographs obtained earlier today. Cardiac/coronary CTA dated 11/27/2023. FINDINGS: Cardiovascular: Borderline enlarged heart. Minimal atheromatous calcifications, including the coronary arteries and aorta. Borderline dilated central pulmonary arteries with a main pulmonary artery diameter of 2.9 cm. No pericardial effusion. Mediastinum/Nodes: No enlarged mediastinal or axillary lymph nodes. Thyroid  gland, trachea, and esophagus demonstrate no significant findings. Lungs/Pleura: Interval patchy opacities in the anterior left upper lobe and in the left lower lobe. Small left pleural effusion. Clear right lung. Upper Abdomen: 1.8 cm lateral segment left lobe liver hemangioma or mildly complicated cyst. 6.0 cm simple appearing, partially included right renal cyst. This  does not need imaging follow-up. Musculoskeletal: Elevated left hemidiaphragm. Thoracic spine degenerative changes and mild scoliosis. IMPRESSION: 1. Interval patchy opacities in the anterior left upper lobe and in the left lower lobe, compatible with pneumonia. 2. Small left pleural effusion. 3. Borderline dilated central pulmonary arteries, suggesting borderline pulmonary arterial hypertension. 4. Minimal calcific coronary artery and aortic atherosclerosis. Aortic Atherosclerosis (ICD10-I70.0). Electronically Signed   By: Elspeth Bathe M.D.   On: 07/01/2024 16:06   DG Chest 2 View Result  Date: 07/01/2024 CLINICAL DATA:  Left-sided chest pain for 2 days, nausea, generalized weakness EXAM: CHEST - 2 VIEW COMPARISON:  07/16/2013 FINDINGS: Frontal and lateral views of the chest demonstrate an unremarkable cardiac silhouette. There is consolidation at the left lung base, obscuring portions of the cardiac apex silhouette and left hemidiaphragm, consistent with lingular and left lower lobe consolidation. Small left effusion cannot be excluded. The right chest is clear. No pneumothorax. No acute bony abnormalities. IMPRESSION: 1. Left basilar consolidation involving the lingula and left lower lobe, which may reflect atelectasis or airspace disease. 2. Small left pleural effusion is suspected. Electronically Signed   By: Ozell Daring M.D.   On: 07/01/2024 13:37        Scheduled Meds:  azithromycin   500 mg Oral Daily   enoxaparin  (LOVENOX ) injection  40 mg Subcutaneous Q24H   Continuous Infusions:  cefTRIAXone  (ROCEPHIN )  IV       LOS: 0 days     Sarah KATHEE Ban, MD [facility]   If 7PM-7AM, please contact night-coverage [email or web address] Password [facility] 07/02/2024, 11:25 AM

## 2024-07-02 NOTE — Progress Notes (Signed)
       CROSS COVER NOTE  NAME: [name] MRN: [id number] DOB : 1945-12-15    Concern as stated by nurse / staff   Call from [facility] radiology regarding CTA done: Shows bilateral PE left> right with LV RV ratio 1.09 suggesting at least submassive PE     Pertinent findings on chart review: Patient admitted on 8/16 with community-acquired pneumonia after presenting with pleuritic chest pain and low-grade fever, further evaluated today with D-dimer and subsequently CTA chest  Patient Assessment    Date/Time Temp HR Resp BP SpO2 O2   07/02/24 19:27:06 100.3 F (37.9 C) 83 -- 121/90  Abnormal  92 % --  07/02/24 1732 99.5 F (37.5 C) 73 23  Abnormal  137/77 92 % 3 L/min  07/02/24 1730 -- -- -- -- 86 % Abnormal  --  07/02/24 15:53:47 98.4 F (36.9 C) 74 16 113/49  Abnormal  91 % --  Physical Exam Vitals and nursing note reviewed.  Constitutional:      General: She is not in acute distress. HENT:     Head: Normocephalic and atraumatic.  Cardiovascular:     Rate and Rhythm: Normal rate and regular rhythm.     Heart sounds: Normal heart sounds.  Pulmonary:     Effort: Pulmonary effort is normal.     Breath sounds: Normal breath sounds.  Abdominal:     Palpations: Abdomen is soft.     Tenderness: There is no abdominal tenderness.  Neurological:     Mental Status: Mental status is at baseline.       Assessment and  Interventions   Assessment:  -Bilateral/submassive pulmonary embolism with right heart strain -Acute hypoxic respiratory failure   Plan:   Heparin  per pharmacy consult(DC SQ Lovenox ) Continue supplemental oxygen Pain control with non-NSAIDs to decrease risk of bleeding Echocardiogram, bilateral venous ultrasound Stat consult to vascular: Discussed with on-call vascular surgeon, Dr. Tisa to evaluate for need for, and urgency thereof for thrombectomy N.p.o. from midnight Transferring to stepdown for for closer hemodynamic monitoring       CRITICAL CARE Performed by: Delayne LULLA Solian   Total critical care time: 60 minutes  Critical care time was exclusive of separately billable procedures and treating other patients.  Critical care was necessary to treat or prevent imminent or life-threatening deterioration.  Critical care was time spent personally by me on the following activities: development of treatment plan with patient and/or surrogate as well as nursing, discussions with consultants, evaluation of patient's response to treatment, examination of patient, obtaining history from patient or surrogate, ordering and performing treatments and interventions, ordering and review of laboratory studies, ordering and review of radiographic studies, pulse oximetry and re-evaluation of patient's condition.

## 2024-07-03 ENCOUNTER — Inpatient Hospital Stay

## 2024-07-03 ENCOUNTER — Encounter
Admission: EM | Disposition: A | Discharge status: Home / Self Care | Payer: Self-pay | Source: Home / Self Care | Attending: Student

## 2024-07-03 ENCOUNTER — Inpatient Hospital Stay (HOSPITAL_COMMUNITY)
Admit: 2024-07-03 | Discharge: 2024-07-03 | Disposition: A | Discharge status: Home / Self Care | Attending: Internal Medicine | Admitting: Internal Medicine

## 2024-07-03 DIAGNOSIS — R079 Chest pain, unspecified: Secondary | ICD-10-CM

## 2024-07-03 DIAGNOSIS — I50810 Right heart failure, unspecified: Secondary | ICD-10-CM

## 2024-07-03 DIAGNOSIS — J90 Pleural effusion, not elsewhere classified: Secondary | ICD-10-CM

## 2024-07-03 DIAGNOSIS — I2609 Other pulmonary embolism with acute cor pulmonale: Secondary | ICD-10-CM

## 2024-07-03 DIAGNOSIS — J189 Pneumonia, unspecified organism: Secondary | ICD-10-CM | POA: Diagnosis not present

## 2024-07-03 HISTORY — PX: PULMONARY THROMBECTOMY: CATH118295

## 2024-07-03 LAB — ECHOCARDIOGRAM COMPLETE
AR max vel: 3.2 cm2
AV Area VTI: 3.37 cm2
AV Area mean vel: 3.58 cm2
AV Mean grad: 4 mmHg
AV Peak grad: 8.8 mmHg
Ao pk vel: 1.49 m/s
Area-P 1/2: 2.48 cm2
Height: 66 in
MV VTI: 3.05 cm2
S' Lateral: 2.6 cm
Weight: 2916.8 [oz_av]

## 2024-07-03 LAB — BASIC METABOLIC PANEL WITH GFR
Anion gap: 9 (ref 5–15)
BUN: 24 mg/dL — ABNORMAL HIGH (ref 8–23)
CO2: 25 mmol/L (ref 22–32)
Calcium: 8.7 mg/dL — ABNORMAL LOW (ref 8.9–10.3)
Chloride: 106 mmol/L (ref 98–111)
Creatinine, Ser: 1.08 mg/dL — ABNORMAL HIGH (ref 0.44–1.00)
GFR, Estimated: 53 mL/min — ABNORMAL LOW (ref >60)
Glucose, Bld: 104 mg/dL — ABNORMAL HIGH (ref 70–99)
Potassium: 4 mmol/L (ref 3.5–5.1)
Sodium: 140 mmol/L (ref 135–145)

## 2024-07-03 LAB — CBC
HCT: 38.7 % (ref 36.0–46.0)
Hemoglobin: 13.2 g/dL (ref 12.0–15.0)
MCH: 30.2 pg (ref 26.0–34.0)
MCHC: 34.1 g/dL (ref 30.0–36.0)
MCV: 88.6 fL (ref 80.0–100.0)
Platelets: 183 K/uL (ref 150–400)
RBC: 4.37 MIL/uL (ref 3.87–5.11)
RDW: 12.4 % (ref 11.5–15.5)
WBC: 14.2 K/uL — ABNORMAL HIGH (ref 4.0–10.5)
nRBC: 0 % (ref 0.0–0.2)

## 2024-07-03 LAB — HEPARIN LEVEL (UNFRACTIONATED)
Heparin Unfractionated: 0.35 [IU]/mL (ref 0.30–0.70)
Heparin Unfractionated: 0.27 [IU]/mL — ABNORMAL LOW (ref 0.30–0.70)

## 2024-07-03 SURGERY — PULMONARY THROMBECTOMY
Anesthesia: Moderate Sedation

## 2024-07-03 MED ORDER — SODIUM CHLORIDE 0.9 % IV SOLN: INTRAVENOUS | Status: DC

## 2024-07-03 MED ORDER — METHYLPREDNISOLONE SODIUM SUCC 125 MG IJ SOLR: 125.0000 mg | Freq: Once | INTRAMUSCULAR | Status: DC | PRN

## 2024-07-03 MED ORDER — FENTANYL CITRATE (PF) 100 MCG/2ML IJ SOLN
INTRAMUSCULAR | Status: AC
  Filled 2024-07-03: qty 2

## 2024-07-03 MED ORDER — FENTANYL CITRATE (PF) 100 MCG/2ML IJ SOLN
INTRAMUSCULAR | Status: DC | PRN
  Administered 2024-07-03: 25 ug via INTRAVENOUS
  Administered 2024-07-03: 50 ug via INTRAVENOUS

## 2024-07-03 MED ORDER — DIPHENHYDRAMINE HCL 50 MG/ML IJ SOLN: 50.0000 mg | Freq: Once | INTRAMUSCULAR | Status: DC | PRN

## 2024-07-03 MED ORDER — MIDAZOLAM HCL 5 MG/5ML IJ SOLN
INTRAMUSCULAR | Status: AC
  Filled 2024-07-03: qty 5

## 2024-07-03 MED ORDER — MIDAZOLAM HCL 2 MG/ML PO SYRP: 8.0000 mg | ORAL_SOLUTION | Freq: Once | ORAL | Status: DC | PRN

## 2024-07-03 MED ORDER — IODIXANOL 320 MG/ML IV SOLN
INTRAVENOUS | Status: DC | PRN
  Administered 2024-07-03: 50 mL via INTRAVENOUS

## 2024-07-03 MED ORDER — CEFAZOLIN SODIUM-DEXTROSE 2-4 GM/100ML-% IV SOLN
INTRAVENOUS | Status: AC
  Filled 2024-07-03: qty 100

## 2024-07-03 MED ORDER — LIDOCAINE-EPINEPHRINE (PF) 1 %-1:200000 IJ SOLN
INTRAMUSCULAR | Status: DC | PRN
  Administered 2024-07-03: 10 mL via INTRADERMAL

## 2024-07-03 MED ORDER — HEPARIN (PORCINE) IN NACL 1000-0.9 UT/500ML-% IV SOLN
INTRAVENOUS | Status: DC | PRN
  Administered 2024-07-03: 1000 mL

## 2024-07-03 MED ORDER — FAMOTIDINE 20 MG PO TABS: 40.0000 mg | ORAL_TABLET | Freq: Once | ORAL | Status: DC | PRN

## 2024-07-03 MED ORDER — HEPARIN SODIUM (PORCINE) 1000 UNIT/ML IJ SOLN
INTRAMUSCULAR | Status: DC | PRN
  Administered 2024-07-03: 3000 [IU] via INTRAVENOUS

## 2024-07-03 MED ORDER — HEPARIN SODIUM (PORCINE) 1000 UNIT/ML IJ SOLN
INTRAMUSCULAR | Status: AC
Start: 2024-07-03 — End: 2024-07-03
  Filled 2024-07-03: qty 10

## 2024-07-03 MED ORDER — CEFAZOLIN SODIUM-DEXTROSE 2-4 GM/100ML-% IV SOLN
2.0000 g | INTRAVENOUS | Status: AC
  Administered 2024-07-03: 2 g via INTRAVENOUS

## 2024-07-03 MED ORDER — MIDAZOLAM HCL 2 MG/2ML IJ SOLN
INTRAMUSCULAR | Status: DC | PRN
  Administered 2024-07-03 (×2): 1 mg via INTRAVENOUS

## 2024-07-03 SURGICAL SUPPLY — 16 items
CANISTER PENUMBRA ENGINE (MISCELLANEOUS) IMPLANT
CATH ANGIO 5F 100CM .035 PIG (CATHETERS) IMPLANT
CATH INDIGO 12XTORQ 100 (CATHETERS) IMPLANT
CATH INDIGO SEP 12 (CATHETERS) IMPLANT
CATH SELECT BERN TIP 5F 130 (CATHETERS) IMPLANT
CLOSURE PERCLOSE PROSTYLE (VASCULAR PRODUCTS) IMPLANT
COVER PROBE ULTRASOUND 5X96 (MISCELLANEOUS) IMPLANT
GLIDEWIRE ADV .035X260CM (WIRE) IMPLANT
PACK ANGIOGRAPHY (CUSTOM PROCEDURE TRAY) ×1 IMPLANT
SHEATH BRITE TIP 6FRX11 (SHEATH) IMPLANT
SHEATH CHCK-FLO 14FR 13 (SHEATH) IMPLANT
SUT MNCRL AB 4-0 PS2 18 (SUTURE) IMPLANT
SYR MEDRAD MARK 7 150ML (SYRINGE) IMPLANT
TUBING CONTRAST HIGH PRESS 72 (TUBING) IMPLANT
WIRE J 3MM .035X145CM (WIRE) IMPLANT
WIRE SUPRACORE 190CM (WIRE) IMPLANT

## 2024-07-03 NOTE — Progress Notes (Addendum)
  Progress Note   Patient: [name] FMW:[id number] DOB: 1946/07/29 DOA: 07/01/2024     1 DOS: the patient was seen and examined on 07/03/2024 at 8:20AM      Brief hospital course: [name] with HTN, HLD, hypothyroidism who presented with chest pain.  Initially admitted for pneumonia, then found to have PE.    Vascular surgery consulted for thrombectomy.     Assessment and Plan: Acute pulmonary embolism Initially admitted with working Dx pneumonia, then found to have elevated dimer.  CTA confirmed large PE, vascular consulted for thrombectomy which she underwent today. 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  Not on diuretic as an outpatient. -Continue metoprolol  - Hold losartan - Continue Lipitor  Hypothyroidism -Continue levothyroxine  and liothyronine   Anxiety -Continue Lexapro         Subjective: Patient doing okay, she still has some pleuritic pain, she is still on oxygen.  She is going for thrombectomy today     Physical Exam: BP 121/63   Pulse 67   Temp 98.4 F (36.9 C) (Temporal)   Resp 20   Ht 5' 6 (1.676 m)   Wt 82.7 kg   SpO2 93%   BMI 29.42 kg/m   Adult female, lying in bed, interactive and appropriate RRR, no murmurs, no peripheral edema Respiratory normal, lungs clear without rales or wheezes Abdomen soft, no tenderness palpation or guarding, no ascites or distention Attention normal, affect normal, judgment and insight appear normal    Data Reviewed: Basic metabolic panel shows normal electrolytes and renal function CBC shows mild leukocytosis CT angiogram report reviewed   Family Communication: called to husband and daughter no answer    Disposition: Status is: Inpatient 78 yo F  admitted with chest pain and dyspnea, initially thought to have pneumonia, then found to have large PE, taken for thrombectomy today.  Will continue heparin  gtt, transition to oral anticoagulation as soon as able.        Author: Lonni SHAUNNA Dalton, MD 07/03/2024 5:57 PM  For on call review [email or web address].

## 2024-07-03 NOTE — Hospital Course (Signed)
 [name] with HTN, HLD, hypothyroidism who presented with chest pain.  Initially admitted for pneumonia, then found to have PE.    Vascular surgery consulted for thrombectomy.

## 2024-07-03 NOTE — Progress Notes (Signed)
*  PRELIMINARY RESULTS* Echocardiogram 2D Echocardiogram has been performed.  [name] 07/03/2024, 1:48 PM

## 2024-07-03 NOTE — Progress Notes (Signed)
 Per Gwendlyn Shank, NP, okay to give PO meds prior to procedure.

## 2024-07-03 NOTE — Plan of Care (Signed)
 Pt arrived to unit alert and oriented. Heparin  gtt started and continued. O2 weaned to 4L [place]. Surgical procedure planned for today. Pt rested most of night with minimal awakenings. Bed pan x 1 for urine.      Problem: Education: Goal: Knowledge of General Education information will improve Description: Including pain rating scale, medication(s)/side effects and non-pharmacologic comfort measures Outcome: Progressing   Problem: Health Behavior/Discharge Planning: Goal: Ability to manage health-related needs will improve Outcome: Progressing   Problem: Clinical Measurements: Goal: Ability to maintain clinical measurements within normal limits will improve Outcome: Progressing Goal: Will remain free from infection Outcome: Progressing Goal: Diagnostic test results will improve Outcome: Progressing Goal: Respiratory complications will improve Outcome: Progressing Goal: Cardiovascular complication will be avoided Outcome: Progressing   Problem: Activity: Goal: Risk for activity intolerance will decrease Outcome: Progressing   Problem: Nutrition: Goal: Adequate nutrition will be maintained Outcome: Progressing   Problem: Coping: Goal: Level of anxiety will decrease Outcome: Progressing   Problem: Elimination: Goal: Will not experience complications related to bowel motility Outcome: Progressing Goal: Will not experience complications related to urinary retention Outcome: Progressing   Problem: Pain Managment: Goal: General experience of comfort will improve and/or be controlled Outcome: Progressing   Problem: Safety: Goal: Ability to remain free from injury will improve Outcome: Progressing   Problem: Skin Integrity: Goal: Risk for impaired skin integrity will decrease Outcome: Progressing   Problem: Activity: Goal: Ability to tolerate increased activity will improve Outcome: Progressing   Problem: Clinical Measurements: Goal: Ability to maintain a body  temperature in the normal range will improve Outcome: Progressing   Problem: Respiratory: Goal: Ability to maintain adequate ventilation will improve Outcome: Progressing Goal: Ability to maintain a clear airway will improve Outcome: Progressing

## 2024-07-03 NOTE — Plan of Care (Signed)

## 2024-07-03 NOTE — H&P (View-Only) (Signed)
 Progress Note    07/03/2024 9:50 AM * No surgery found *  Subjective:  [name] is a 78 yo female past medical history of hypertension and tobacco use presented with approximately 3 to 4 days of worsening shortness of breath left-sided chest pain and cough. She was admitted and being treated for acute community acquired pneumonia yesterday. Continued evaluation obtained a CT PE protocol this evening which revealed bilateral submassive pulmonary emboli left greater than right with right heart strain. Patient has been hemodynamically stable without tachycardia saturating 92 to 97% on 2 to 3 L nasal cannula. She states she does have some left sided chest discomfort but is otherwise without complaint. She denies lower extremity pain or swelling.    Vitals:   07/03/24 0758 07/03/24 0800  BP:  (!) 141/73  Pulse:  68  Resp:  15  Temp: 98.3 F (36.8 C)   SpO2:  96%   Physical Exam: Cardiac:  RRR, normal S1 and S2. Lungs: Nonlabored breathing this morning.  Clear on auscultation but diminished in multiple places.  Patient still endorses pain on deep inspiration to left chest. Incisions: None Extremities: Palpable pulses throughout and all extremities are warm today. Abdomen: Positive bowel sounds throughout, soft, nontender nondistended. Neurologic: Alert and oriented x 3, answers all questions and follows commands appropriately.  CBC    Component Value Date/Time   WBC 14.2 (H) 07/03/2024 0328   RBC 4.37 07/03/2024 0328   HGB 13.2 07/03/2024 0328   HGB 13.2 07/17/2013 0413   HCT 38.7 07/03/2024 0328   HCT 37.9 07/17/2013 0413   PLT 183 07/03/2024 0328   PLT 163 07/17/2013 0413   MCV 88.6 07/03/2024 0328   MCV 92 07/17/2013 0413   MCH 30.2 07/03/2024 0328   MCHC 34.1 07/03/2024 0328   RDW 12.4 07/03/2024 0328   RDW 13.1 07/17/2013 0413   LYMPHSABS 1.7 07/17/2013 0413   MONOABS 1.4 (H) 07/17/2013 0413   EOSABS 0.3 07/17/2013 0413   BASOSABS 0.0 07/17/2013 0413    BMET     Component Value Date/Time   NA 140 07/03/2024 0328   NA 146 (H) 07/17/2013 0413   K 4.0 07/03/2024 0328   K 3.5 07/17/2013 0413   CL 106 07/03/2024 0328   CL 116 (H) 07/17/2013 0413   CO2 25 07/03/2024 0328   CO2 25 07/17/2013 0413   GLUCOSE 104 (H) 07/03/2024 0328   GLUCOSE 89 07/17/2013 0413   BUN 24 (H) 07/03/2024 0328   BUN 11 07/17/2013 0413   CREATININE 1.08 (H) 07/03/2024 0328   CREATININE 1.02 07/17/2013 0413   CALCIUM  8.7 (L) 07/03/2024 0328   CALCIUM  8.0 (L) 07/17/2013 0413   GFRNONAA 53 (L) 07/03/2024 0328   GFRNONAA 58 (L) 07/17/2013 0413   GFRAA >60 07/17/2013 0413    INR No results found for: INR   Intake/Output Summary (Last 24 hours) at 07/03/2024 0950 Last data filed at 07/03/2024 0400 Gross per 24 hour  Intake 239.15 ml  Output 100 ml  Net 139.15 ml     Assessment/Plan:  78 y.o. female who presented to [facility] emergency department with 3 to 4 days of worsening shortness of breath and left-sided chest pain with cough.  Upon evaluation a CT PE protocol was obtained which revealed bilateral submassive pulmonary emboli left greater than right with right heart strain.  Oxygen saturations ranging from 90 to 94% this morning.  Patient remains on 2 to 3 L nasal cannula oxygen.  Heparin  infusion was started at  that time.* No surgery found *   PLAN Vascular surgery plans on taking the patient to the vascular lab later today on 07/03/2024 for pulmonary thrombectomy.  I discussed in detail at the bedside this morning with the patient the procedure, benefits, risk, complications.  Patient verbalizes her understanding wishes to proceed.  I answered all the patient's questions this morning.  Patient is requesting to go home tomorrow morning if she feels better due to the fact she has a husband at home that she cares for.  Patient endorses she has been n.p.o. since midnight last night.  Patient remains on heparin  infusion prior to procedure.  I discussed the case in detail  with Dr. Selinda Gu MD and he agrees with the plan.  DVT prophylaxis: Heparin  infusion.   [name] [facility] 07/03/2024 9:50 AM

## 2024-07-03 NOTE — Progress Notes (Signed)
 Progress Note    07/03/2024 9:50 AM * No surgery found *  Subjective:  [name] is a 78 yo female past medical history of hypertension and tobacco use presented with approximately 3 to 4 days of worsening shortness of breath left-sided chest pain and cough. She was admitted and being treated for acute community acquired pneumonia yesterday. Continued evaluation obtained a CT PE protocol this evening which revealed bilateral submassive pulmonary emboli left greater than right with right heart strain. Patient has been hemodynamically stable without tachycardia saturating 92 to 97% on 2 to 3 L nasal cannula. She states she does have some left sided chest discomfort but is otherwise without complaint. She denies lower extremity pain or swelling.    Vitals:   07/03/24 0758 07/03/24 0800  BP:  (!) 141/73  Pulse:  68  Resp:  15  Temp: 98.3 F (36.8 C)   SpO2:  96%   Physical Exam: Cardiac:  RRR, normal S1 and S2. Lungs: Nonlabored breathing this morning.  Clear on auscultation but diminished in multiple places.  Patient still endorses pain on deep inspiration to left chest. Incisions: None Extremities: Palpable pulses throughout and all extremities are warm today. Abdomen: Positive bowel sounds throughout, soft, nontender nondistended. Neurologic: Alert and oriented x 3, answers all questions and follows commands appropriately.  CBC    Component Value Date/Time   WBC 14.2 (H) 07/03/2024 0328   RBC 4.37 07/03/2024 0328   HGB 13.2 07/03/2024 0328   HGB 13.2 07/17/2013 0413   HCT 38.7 07/03/2024 0328   HCT 37.9 07/17/2013 0413   PLT 183 07/03/2024 0328   PLT 163 07/17/2013 0413   MCV 88.6 07/03/2024 0328   MCV 92 07/17/2013 0413   MCH 30.2 07/03/2024 0328   MCHC 34.1 07/03/2024 0328   RDW 12.4 07/03/2024 0328   RDW 13.1 07/17/2013 0413   LYMPHSABS 1.7 07/17/2013 0413   MONOABS 1.4 (H) 07/17/2013 0413   EOSABS 0.3 07/17/2013 0413   BASOSABS 0.0 07/17/2013 0413    BMET     Component Value Date/Time   NA 140 07/03/2024 0328   NA 146 (H) 07/17/2013 0413   K 4.0 07/03/2024 0328   K 3.5 07/17/2013 0413   CL 106 07/03/2024 0328   CL 116 (H) 07/17/2013 0413   CO2 25 07/03/2024 0328   CO2 25 07/17/2013 0413   GLUCOSE 104 (H) 07/03/2024 0328   GLUCOSE 89 07/17/2013 0413   BUN 24 (H) 07/03/2024 0328   BUN 11 07/17/2013 0413   CREATININE 1.08 (H) 07/03/2024 0328   CREATININE 1.02 07/17/2013 0413   CALCIUM  8.7 (L) 07/03/2024 0328   CALCIUM  8.0 (L) 07/17/2013 0413   GFRNONAA 53 (L) 07/03/2024 0328   GFRNONAA 58 (L) 07/17/2013 0413   GFRAA >60 07/17/2013 0413    INR No results found for: INR   Intake/Output Summary (Last 24 hours) at 07/03/2024 0950 Last data filed at 07/03/2024 0400 Gross per 24 hour  Intake 239.15 ml  Output 100 ml  Net 139.15 ml     Assessment/Plan:  78 y.o. female who presented to [facility] emergency department with 3 to 4 days of worsening shortness of breath and left-sided chest pain with cough.  Upon evaluation a CT PE protocol was obtained which revealed bilateral submassive pulmonary emboli left greater than right with right heart strain.  Oxygen saturations ranging from 90 to 94% this morning.  Patient remains on 2 to 3 L nasal cannula oxygen.  Heparin  infusion was started at  that time.* No surgery found *   PLAN Vascular surgery plans on taking the patient to the vascular lab later today on 07/03/2024 for pulmonary thrombectomy.  I discussed in detail at the bedside this morning with the patient the procedure, benefits, risk, complications.  Patient verbalizes her understanding wishes to proceed.  I answered all the patient's questions this morning.  Patient is requesting to go home tomorrow morning if she feels better due to the fact she has a husband at home that she cares for.  Patient endorses she has been n.p.o. since midnight last night.  Patient remains on heparin  infusion prior to procedure.  I discussed the case in detail  with Dr. Selinda Gu MD and he agrees with the plan.  DVT prophylaxis: Heparin  infusion.   [name] [facility] 07/03/2024 9:50 AM

## 2024-07-03 NOTE — Progress Notes (Signed)
 PHARMACY - ANTICOAGULATION CONSULT NOTE  Pharmacy Consult for Heparin  Indication: pulmonary embolus  Allergies  Allergen Reactions   Ciprofloxacin Other (See Comments)    Caused cdiff.   C-Diff    Patient Measurements: Height: 5' 6 (167.6 cm) Weight: 82.7 kg (182 lb 4.8 oz) IBW/kg (Calculated) : 59.3 HEPARIN  DW (KG): 76.7  Vital Signs: Temp: 99.5 F (37.5 C) (08/17 2047) Temp Source: Oral (08/17 2047) BP: 133/58 (08/18 0400) Pulse Rate: 63 (08/18 0400)  Labs: Recent Labs    07/01/24 1255 07/02/24 0448 07/02/24 1145 07/03/24 0328  HGB 14.9 13.6  --  13.2  HCT 43.1 40.5  --  38.7  PLT 197 183  --  183  HEPARINUNFRC  --   --   --  0.35  CREATININE 0.94  --   --  1.08*  TROPONINIHS 12  --  10  --     Estimated Creatinine Clearance: 48.1 mL/min (A) (by C-G formula based on SCr of 1.08 mg/dL (H)).   Medical History: Past Medical History:  Diagnosis Date   Arthritis    C. difficile diarrhea    Hypertension    Thyroid  disease     Medications:  Scheduled:   atorvastatin   10 mg Oral Daily   azithromycin   500 mg Oral Daily   Chlorhexidine  Gluconate Cloth  6 each Topical Daily   escitalopram   10 mg Oral Daily   levothyroxine   150 mcg Oral Q0600   liothyronine   25 mcg Oral Daily   metoprolol  succinate  25 mg Oral Daily   polyethylene glycol  17 g Oral Daily   Infusions:   cefTRIAXone  (ROCEPHIN )  IV Stopped (07/02/24 2108)   heparin  1,300 Units/hr (07/03/24 0400)   PRN: hydrOXYzine , morphine  injection, ondansetron   Assessment: 78 yo female presenting with bilateral pulmonary emboli, left greater than right. No history of PTA anticoagulation noted. Baseline CBC stable. Pharmacy consulted for heparin  dosing.   Goal of Therapy:  Heparin  level 0.3-0.7 units/ml Monitor platelets by anticoagulation protocol: Yes   Plan:  8/18:  HL @ 0328 = 0.35, therapeutic X 1 - will continue pt on current rate and recheck HL in 8 hrs - Monitor CBC and for signs/symptoms  of bleeding.  Jana Swartzlander D 07/03/2024 4:45 AM

## 2024-07-03 NOTE — Progress Notes (Signed)
*  PRELIMINARY RESULTS* Echocardiogram 2D Echocardiogram has been performed.  Floydene Harder 07/03/2024, 1:48 PM

## 2024-07-03 NOTE — Op Note (Signed)
 [facility]  Percutaneous Study/Intervention Procedural Note   Date of Surgery: 07/03/2024,3:50 PM  Surgeon: Selinda Gu  Pre-operative Diagnosis: Symptomatic bilateral pulmonary emboli  Post-operative diagnosis:  Same  Procedure(s) Performed:  1.  Contrast injection right heart  2.  Mechanical thrombectomy to the right lower lobe pulmonary artery into the left upper lobe and lower lobe pulmonary arteries using the penumbra CAT 12 catheter  3.  Selective catheter placement left lower lobe and left upper lobe pulmonary artery  4.  Selective catheter placement right lower lobe, middle lobe, and upper lobe pulmonary arteries      Anesthesia: Conscious sedation was administered under my direct supervision by the interventional radiology RN. IV Versed  plus fentanyl  were utilized. Continuous ECG, pulse oximetry and blood pressure was monitored throughout the entire procedure.  Versed  and fentanyl  were administered intravenously.  Conscious sedation was administered for a total of 24 minutes using 2 mg of Versed  and 75 mcg of Fentanyl .  EBL: 300 cc  Sheath: 14 French right femoral vein  Contrast: 50 cc   Fluoroscopy Time: 6.3 minutes  Indications:  Patient presents with pulmonary emboli. The patient is symptomatic with hypoxemia and dyspnea on exertion.  There is evidence of right heart strain on the CT angiogram. The patient is otherwise a good candidate for intervention and even the long-term benefits pulmonary angiography with thrombolysis is offered. The risks and benefits are reviewed long-term benefits are discussed. All questions are answered patient agrees to proceed.  Procedure:  [name] a 78 y.o. female who was identified and appropriate procedural time out was performed.  The patient was then placed supine on the table and prepped and draped in the usual sterile fashion.  Ultrasound was used to evaluate the right common femoral vein.  It was patent,  as it was echolucent and compressible.  A digital ultrasound image was acquired for the permanent record.  A Seldinger needle was used to access the right common femoral vein under direct ultrasound guidance.  A 0.035 J wire was advanced without resistance and a 6Fr sheath was placed. A proglide device was placed in a preclose fashion and then upsized to a 14 [place] sheath.    The Advantage wire and pigtail catheter were then negotiated into the right atrium and bolus injection of contrast was utilized to demonstrate the right ventricle and the pulmonary artery outflow. The Advantage wire and catheter were then negotiated into the the pulmonary arteries.  The left pulmonary artery was cannulated and advanced into the left upper lobe and left lower lobe for selective imaging. This demonstrated extensive thrombosis from the distal left main pulmonary artery extending into the left upper lobe and lower lobe pulmonary arteries.  I then transitioned to the right side with the advantage wire and catheter and first cannulated the right lower lobe and then the right middle and upper lobes.  This demonstrated significant thrombosis of the right lower lobe pulmonary artery without any significant thrombus in the middle or upper lobe pulmonary arteries.  3000 Units of heparin  was then given and allowed to circulate.   The Penumbra Cat 12 catheter was then advanced up into the pulmonary vasculature. The left lung was addressed first. Catheter was negotiated into the left lower lobe pulmonary artery and mechanical thrombectomy was performed with the help of the separator. Follow-up imaging demonstrated a good result and therefore the catheter was renegotiated into the left upper lobe pulmonary artery and again mechanical thrombectomy was performed. Passes  were made with both the Penumbra catheter itself as well as introducing the separator. Follow-up imaging was then performed with a catheter pullback into the left main  pulmonary artery.  This demonstrated marked improvement with only a small amount of thrombus burden remaining after extensive pulmonary thrombectomy on the left side..  The Penumbra Cat 12 catheter was then negotiated to the opposite side. The right lung was then addressed. Catheter was negotiated into the right lower lobe pulmonary artery and mechanical thrombectomy was performed with the separator. Passes were made with both the Penumbra catheter itself as well as introducing the separator. Follow-up imaging was then performed.  Significant improvement was seen with only a small amount of residual thrombus in the right lower lobe pulmonary artery after thrombectomy.  After review these images wires were reintroduced and the catheter is removed. Then, the sheath is then pulled, the proglide device is secured, a Monocryl skin suture was placed and pressure is held. A sterile dressing is placed.    Findings:   Right heart imaging:  Right atrium and right ventricle and the pulmonary outflow tract appears somewhat dilated  Right lung:  This demonstrated significant thrombosis of the right lower lobe pulmonary artery without any significant thrombus in the middle or upper lobe pulmonary arteries.  Left lung:  This demonstrated extensive thrombosis from the distal left main pulmonary artery extending into the left upper lobe and lower lobe pulmonary arteries    Disposition: Patient was taken to the recovery room in stable condition having tolerated the procedure well.  [name] 07/03/2024,3:50 PM

## 2024-07-03 NOTE — Interval H&P Note (Signed)
 History and Physical Interval Note:  07/03/2024 2:33 PM  [name]  has presented today for surgery, with the diagnosis of Pulmonary embolism.  The various methods of treatment have been discussed with the patient and family. After consideration of risks, benefits and other options for treatment, the patient has consented to  Procedure(s): PULMONARY THROMBECTOMY (N/A) as a surgical intervention.  The patient's history has been reviewed, patient examined, no change in status, stable for surgery.  I have reviewed the patient's chart and labs.  Questions were answered to the patient's satisfaction.     Cyruss Arata

## 2024-07-04 ENCOUNTER — Encounter: Payer: Self-pay | Admitting: Vascular Surgery

## 2024-07-04 DIAGNOSIS — I2699 Other pulmonary embolism without acute cor pulmonale: Secondary | ICD-10-CM | POA: Diagnosis not present

## 2024-07-04 DIAGNOSIS — I82411 Acute embolism and thrombosis of right femoral vein: Principal | ICD-10-CM

## 2024-07-04 DIAGNOSIS — Z9889 Other specified postprocedural states: Secondary | ICD-10-CM

## 2024-07-04 DIAGNOSIS — I2602 Saddle embolus of pulmonary artery with acute cor pulmonale: Secondary | ICD-10-CM | POA: Diagnosis not present

## 2024-07-04 LAB — CBC
HCT: 35.2 % — ABNORMAL LOW (ref 36.0–46.0)
Hemoglobin: 11.9 g/dL — ABNORMAL LOW (ref 12.0–15.0)
MCH: 29.9 pg (ref 26.0–34.0)
MCHC: 33.8 g/dL (ref 30.0–36.0)
MCV: 88.4 fL (ref 80.0–100.0)
Platelets: 211 K/uL (ref 150–400)
RBC: 3.98 MIL/uL (ref 3.87–5.11)
RDW: 12.5 % (ref 11.5–15.5)
WBC: 12.9 K/uL — ABNORMAL HIGH (ref 4.0–10.5)
nRBC: 0 % (ref 0.0–0.2)

## 2024-07-04 LAB — HEPARIN LEVEL (UNFRACTIONATED)
Heparin Unfractionated: 0.33 [IU]/mL (ref 0.30–0.70)
Heparin Unfractionated: 0.35 [IU]/mL (ref 0.30–0.70)

## 2024-07-04 LAB — BASIC METABOLIC PANEL WITH GFR
Anion gap: 9 (ref 5–15)
BUN: 24 mg/dL — ABNORMAL HIGH (ref 8–23)
CO2: 24 mmol/L (ref 22–32)
Calcium: 8.6 mg/dL — ABNORMAL LOW (ref 8.9–10.3)
Chloride: 104 mmol/L (ref 98–111)
Creatinine, Ser: 0.88 mg/dL (ref 0.44–1.00)
GFR, Estimated: >60 mL/min (ref >60)
Glucose, Bld: 108 mg/dL — ABNORMAL HIGH (ref 70–99)
Potassium: 3.7 mmol/L (ref 3.5–5.1)
Sodium: 137 mmol/L (ref 135–145)

## 2024-07-04 LAB — LEGIONELLA PNEUMOPHILA SEROGP 1 UR AG: L. pneumophila Serogp 1 Ur Ag: NEGATIVE

## 2024-07-04 MED ORDER — ACETAMINOPHEN 325 MG PO TABS
650.0000 mg | ORAL_TABLET | Freq: Four times a day (QID) | ORAL | Status: DC | PRN
  Administered 2024-07-04: 650 mg via ORAL
  Filled 2024-07-04: qty 2

## 2024-07-04 MED ORDER — CEFUROXIME AXETIL 500 MG PO TABS
500.0000 mg | ORAL_TABLET | Freq: Two times a day (BID) | ORAL | Status: DC
  Administered 2024-07-04 – 2024-07-06 (×3): 500 mg via ORAL
  Filled 2024-07-04 (×7): qty 1

## 2024-07-04 NOTE — Progress Notes (Signed)
 [facility]  - [place] at [facility]   PATIENT NAME: [name]    MR#:  [id number]  DATE OF BIRTH:  11-13-1946  SUBJECTIVE:  no family at bedside. Patient came in with pleuritic chest pain. Found to have pulmonary embolism and right lower extremity extensive DVT.   VITALS:  Blood pressure 125/72, pulse 69, temperature 98.5 F (36.9 C), temperature source Oral, resp. rate (!) 25, height 5' 6 (1.676 m), weight 82.7 kg, SpO2 96%.  PHYSICAL EXAMINATION:   GENERAL:  78 y.o.-year-old patient with no acute distress. Weak LUNGS: Normal breath sounds bilaterally, no wheezing CARDIOVASCULAR: S1, S2 normal. No murmur   ABDOMEN: Soft, nontender, nondistended. Bowel sounds present.  EXTREMITIES: + edema b/l.    NEUROLOGIC: nonfocal  patient is alert and awake SKIN: No obvious rash, lesion, or ulcer.   LABORATORY PANEL:  CBC Recent Labs  Lab 07/04/24 0314  WBC 12.9*  HGB 11.9*  HCT 35.2*  PLT 211    Chemistries  Recent Labs  Lab 07/04/24 0314  NA 137  K 3.7  CL 104  CO2 24  GLUCOSE 108*  BUN 24*  CREATININE 0.88  CALCIUM  8.6*   Cardiac Enzymes No results for input(s): TROPONINI in the last 168 hours. RADIOLOGY:  PERIPHERAL VASCULAR CATHETERIZATION Result Date: 07/03/2024 See surgical note for result.  US  Venous Img Lower Bilateral (DVT) Result Date: 07/03/2024 CLINICAL DATA:  Pulmonary embolism. EXAM: BILATERAL LOWER EXTREMITY VENOUS DOPPLER ULTRASOUND TECHNIQUE: Gray-scale sonography with graded compression, as well as color Doppler and duplex ultrasound were performed to evaluate the lower extremity deep venous systems from the level of the common femoral vein and including the common femoral, femoral, profunda femoral, popliteal and calf veins including the posterior tibial, peroneal and gastrocnemius veins when visible. The superficial great saphenous vein was also interrogated. Spectral Doppler was utilized to evaluate flow at rest and with distal  augmentation maneuvers in the common femoral, femoral and popliteal veins. COMPARISON:  None Available. FINDINGS: RIGHT LOWER EXTREMITY Common Femoral Vein: No evidence of thrombus. Normal compressibility, respiratory phasicity and response to augmentation. Saphenofemoral Junction: No evidence of thrombus. Normal compressibility and flow on color Doppler imaging. Profunda Femoral Vein: No evidence of thrombus. Normal compressibility and flow on color Doppler imaging. Femoral Vein: Proximal femoral vein demonstrates nonocclusive thrombus. Mid to distal femoral vein contains occlusive thrombus. Popliteal Vein: Proximal popliteal vein contains a small amount of flow adjacent to thrombus. Mid to distal popliteal vein demonstrates occlusive thrombus. Calf Veins: Nonocclusive thrombus identified in posterior tibial and peroneal veins. Superficial Great Saphenous Vein: No evidence of thrombus. Normal compressibility. Venous Reflux:  None. Other Findings: No evidence of superficial thrombophlebitis or abnormal fluid collection. LEFT LOWER EXTREMITY Common Femoral Vein: No evidence of thrombus. Normal compressibility, respiratory phasicity and response to augmentation. Saphenofemoral Junction: No evidence of thrombus. Normal compressibility and flow on color Doppler imaging. Profunda Femoral Vein: No evidence of thrombus. Normal compressibility and flow on color Doppler imaging. Femoral Vein: No evidence of thrombus. Normal compressibility, respiratory phasicity and response to augmentation. Popliteal Vein: No evidence of thrombus. Normal compressibility, respiratory phasicity and response to augmentation. Calf Veins: No evidence of thrombus. Normal compressibility and flow on color Doppler imaging. Superficial Great Saphenous Vein: No evidence of thrombus. Normal compressibility. Venous Reflux:  None. Other Findings: No evidence of superficial thrombophlebitis or abnormal fluid collection. IMPRESSION: 1. Positive for deep  vein thrombosis in the right lower extremity. Right femoral vein thrombus is nonocclusive proximally and occlusive in the  mid to distal femoral vein. Right popliteal vein thrombus is nonocclusive proximally and occlusive in the mid to distal popliteal vein. Nonocclusive thrombus also identified in the right posterior tibial and peroneal veins. 2. No evidence of deep vein thrombosis in the left lower extremity. Electronically Signed   By: Marcey Moan M.D.   On: 07/03/2024 15:03   ECHOCARDIOGRAM COMPLETE Result Date: 07/03/2024    ECHOCARDIOGRAM REPORT   Patient Name:   [name] Date of Exam: 07/03/2024 Medical Rec #:  [id number]      Height:       66.0 in Accession #:    [id number]     Weight:       182.3 lb Date of Birth:  Jul 29, 1946       BSA:          1.923 m Patient Age:    78 years       BP:           102/89 mmHg Patient Gender: F              HR:           64 bpm. Exam Location:  [facility] Procedure: 2D Echo, Cardiac Doppler, Color Doppler, Strain Analysis and 3D Echo            (Both Spectral and Color Flow Doppler were utilized during            procedure). Indications:     Chest pain R07.9  History:         Patient has prior history of Echocardiogram examinations, most                  recent 12/13/2023. Risk Factors:Hypertension.  Sonographer:     Christopher Furnace Referring Phys:  [id number] DELAYNE LULLA SOLIAN Diagnosing Phys: Deatrice Cage MD  Sonographer Comments: Suboptimal parasternal window. Global longitudinal strain was attempted. IMPRESSIONS  1. Left ventricular ejection fraction, by estimation, is 55 to 60%. The left ventricle has normal function. The left ventricle has no regional wall motion abnormalities. There is mild left ventricular hypertrophy. Left ventricular diastolic parameters are consistent with Grade I diastolic dysfunction (impaired relaxation). The average left ventricular global longitudinal strain is -20.4 %. The global longitudinal strain is normal.  2. Right ventricular systolic  function is normal. The right ventricular size is normal. Tricuspid regurgitation signal is inadequate for assessing PA pressure.  3. The mitral valve is normal in structure. No evidence of mitral valve regurgitation. No evidence of mitral stenosis.  4. The aortic valve is normal in structure. Aortic valve regurgitation is not visualized. Aortic valve sclerosis/calcification is present, without any evidence of aortic stenosis. Comparison(s): A prior study was performed on 12/11/2023. Changes from prior study are noted. The left ventricular function has improved. FINDINGS  Left Ventricle: Left ventricular ejection fraction, by estimation, is 55 to 60%. The left ventricle has normal function. The left ventricle has no regional wall motion abnormalities. The average left ventricular global longitudinal strain is -20.4 %. Strain was performed and the global longitudinal strain is normal. 3D ejection fraction reviewed and evaluated as part of the interpretation. Alternate measurement of EF is felt to be most reflective of LV function. The left ventricular internal cavity size was normal in size. There is mild left ventricular hypertrophy. Left ventricular diastolic parameters are consistent with Grade I diastolic dysfunction (impaired relaxation). Right Ventricle: The right ventricular size is normal. No increase in right ventricular wall thickness. Right ventricular systolic function  is normal. Tricuspid regurgitation signal is inadequate for assessing PA pressure. Left Atrium: Left atrial size was normal in size. Right Atrium: Right atrial size was normal in size. Pericardium: There is no evidence of pericardial effusion. Mitral Valve: The mitral valve is normal in structure. No evidence of mitral valve regurgitation. No evidence of mitral valve stenosis. MV peak gradient, 3.5 mmHg. The mean mitral valve gradient is 1.0 mmHg. Tricuspid Valve: The tricuspid valve is normal in structure. Tricuspid valve regurgitation is  trivial. No evidence of tricuspid stenosis. Aortic Valve: The aortic valve is normal in structure. Aortic valve regurgitation is not visualized. Aortic valve sclerosis/calcification is present, without any evidence of aortic stenosis. Aortic valve mean gradient measures 4.0 mmHg. Aortic valve peak  gradient measures 8.8 mmHg. Aortic valve area, by VTI measures 3.37 cm. Pulmonic Valve: The pulmonic valve was normal in structure. Pulmonic valve regurgitation is trivial. No evidence of pulmonic stenosis. Aorta: The aortic root is normal in size and structure. Venous: The inferior vena cava was not well visualized. IAS/Shunts: No atrial level shunt detected by color flow Doppler.  LEFT VENTRICLE PLAX 2D LVIDd:         3.60 cm   Diastology LVIDs:         2.60 cm   LV e' medial:    6.31 cm/s LV PW:         1.50 cm   LV E/e' medial:  11.6 LV IVS:        1.40 cm   LV e' lateral:   6.74 cm/s LVOT diam:     2.10 cm   LV E/e' lateral: 10.9 LV SV:         87 LV SV Index:   45        2D Longitudinal Strain LVOT Area:     3.46 cm  2D Strain GLS Avg:     -20.4 %  RIGHT VENTRICLE RV Basal diam:  3.20 cm RV Mid diam:    2.90 cm RV S prime:     11.90 cm/s TAPSE (M-mode): 2.2 cm LEFT ATRIUM             Index        RIGHT ATRIUM           Index LA diam:        3.70 cm 1.92 cm/m   RA Area:     13.30 cm LA Vol (A2C):   45.3 ml 23.56 ml/m  RA Volume:   29.00 ml  15.08 ml/m LA Vol (A4C):   27.3 ml 14.20 ml/m LA Biplane Vol: 38.2 ml 19.87 ml/m  AORTIC VALVE AV Area (Vmax):    3.20 cm AV Area (Vmean):   3.58 cm AV Area (VTI):     3.37 cm AV Vmax:           148.50 cm/s AV Vmean:          93.200 cm/s AV VTI:            0.259 m AV Peak Grad:      8.8 mmHg AV Mean Grad:      4.0 mmHg LVOT Vmax:         137.00 cm/s LVOT Vmean:        96.400 cm/s LVOT VTI:          0.252 m LVOT/AV VTI ratio: 0.97  AORTA Ao Root diam: 2.90 cm MITRAL VALVE  TRICUSPID VALVE MV Area (PHT): 2.48 cm     TR Peak grad:   24.0 mmHg MV Area VTI:    3.05 cm     TR Vmax:        245.00 cm/s MV Peak grad:  3.5 mmHg MV Mean grad:  1.0 mmHg     SHUNTS MV Vmax:       0.94 m/s     Systemic VTI:  0.25 m MV Vmean:      54.2 cm/s    Systemic Diam: 2.10 cm MV Decel Time: 306 msec MV E velocity: 73.40 cm/s MV A velocity: 110.00 cm/s MV E/A ratio:  0.67 Deatrice Cage MD Electronically signed by Deatrice Cage MD Signature Date/Time: 07/03/2024/2:29:36 PM    Final    CT Angio Chest Pulmonary Embolism (PE) W or WO Contrast Addendum Date: 07/02/2024 ADDENDUM REPORT: 07/02/2024 19:40 ADDENDUM: These results were called by telephone at the time of interpretation on 07/02/2024 at 7:39 pm to provider Dr. Cleatus, Who verbally acknowledged these results. Electronically Signed   By: Franky Crease M.D.   On: 07/02/2024 19:40   Result Date: 07/02/2024 CLINICAL DATA:  pleuritic chest pain, hypoxia, elevated dimer EXAM: CT ANGIOGRAPHY CHEST WITH CONTRAST TECHNIQUE: Multidetector CT imaging of the chest was performed using the standard protocol during bolus administration of intravenous contrast. Multiplanar CT image reconstructions and MIPs were obtained to evaluate the vascular anatomy. RADIATION DOSE REDUCTION: This exam was performed according to the departmental dose-optimization program which includes automated exposure control, adjustment of the mA and/or kV according to patient size and/or use of iterative reconstruction technique. CONTRAST:  75mL OMNIPAQUE  IOHEXOL  350 MG/ML SOLN COMPARISON:  07/01/2024 chest x-ray and noncontrast CT. FINDINGS: Cardiovascular: Filling defects within upper and lower lobe pulmonary dural branches on the left as well as right lower lobe pulmonary arterial branches compatible with pulmonary emboli. Elevated RV/LV ratio at 1.09 compatible with right heart strain. Heart is borderline in size. Scattered aortic atherosclerosis. Aorta normal caliber. Mediastinum/Nodes: No mediastinal, hilar, or axillary adenopathy. Trachea and esophagus are  unremarkable. Thyroid  unremarkable. Lungs/Pleura: Small left pleural effusion. Airspace disease in the left upper lobe and left lower lobe could reflect pneumonia or infarcts. No confluent opacity or effusion on the right. Upper Abdomen: No acute findings Musculoskeletal: Chest wall soft tissues are unremarkable. No acute bony abnormality. Review of the MIP images confirms the above findings. IMPRESSION: Bilateral pulmonary emboli, left greater than right. CT evidence of right heart strain (RV/LV Ratio = 1.09) consistent with at least submassive (intermediate risk) PE. The presence of right heart strain has been associated with an increased risk of morbidity and mortality. Please refer to the Code PE Focused order set in EPIC. Small left pleural effusion. Airspace disease in the left upper lobe and left lower lobe could reflect pulmonary infarct or pneumonia. Electronically Signed: By: Franky Crease M.D. On: 07/02/2024 19:17    Assessment and Plan  [name] with HTN, HLD, hypothyroidism who presented with chest pain.  Initially admitted for pneumonia, then found to have PE.     Vascular surgery consulted for evaluation of pulmonary and deep vein thrombosis thrombectomy.  Acute pulmonary embolism right lower extremity DVT --Initially admitted with working Dx pneumonia, then found to have elevated dimer.   --CTA confirmed large PE, vascular consulted for thrombectomy which she underwent today. - Continue heparin  gtt - Post-thrombectomy care per [facility] -- vascular surgery to evaluate for right lower extremity thrombectomy-- await final decision and continue heparin  drip  till then    Possible community acquired pneumonia - clinically does not appear to be pneumonia however will finish course of antibiotic. 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  Not  on diuretic as an outpatient. -Continue metoprolol  - Hold losartan - Continue Lipitor   Hypothyroidism -Continue levothyroxine  and liothyronine    Anxiety -Continue Lexapro      transfer out of ICU. Patient eager to go home as soon as decision regarding right lower extremity DVT is made. She is worried about her husband and his health    Procedures: pulmonary thrombectomy Family communication : none Consults : vascular CODE STATUS: full DVT Prophylaxis : IV heparin  drip Level of care: Telemetry Cardiac Status is: Inpatient Remains inpatient appropriate because: awaiting vascular surgery decision regarding management for right lower extremity DVT    TOTAL TIME TAKING CARE OF THIS PATIENT: 35 minutes.  >50% time spent on counselling and coordination of care  Note: This dictation was prepared with Dragon dictation along with smaller phrase technology. Any transcriptional errors that result from this process are unintentional.  Leita Blanch M.D    [facility]   CC: Primary care physician; Cleotilde Oneil FALCON, MD

## 2024-07-04 NOTE — Plan of Care (Signed)
  Problem: Education: Goal: Knowledge of General Education information will improve Description: Including pain rating scale, medication(s)/side effects and non-pharmacologic comfort measures Outcome: Progressing   Problem: Health Behavior/Discharge Planning: Goal: Ability to manage health-related needs will improve Outcome: Progressing   Problem: Clinical Measurements: Goal: Ability to maintain clinical measurements within normal limits will improve Outcome: Progressing Goal: Will remain free from infection Outcome: Progressing Goal: Cardiovascular complication will be avoided Outcome: Progressing   Problem: Activity: Goal: Risk for activity intolerance will decrease Outcome: Progressing   Problem: Nutrition: Goal: Adequate nutrition will be maintained Outcome: Progressing   Problem: Coping: Goal: Level of anxiety will decrease Outcome: Progressing   Problem: Elimination: Goal: Will not experience complications related to bowel motility Outcome: Progressing Goal: Will not experience complications related to urinary retention Outcome: Progressing   Problem: Pain Managment: Goal: General experience of comfort will improve and/or be controlled Outcome: Progressing   Problem: Safety: Goal: Ability to remain free from injury will improve Outcome: Progressing

## 2024-07-04 NOTE — Progress Notes (Signed)
 PHARMACY - ANTICOAGULATION CONSULT NOTE  Pharmacy Consult for Heparin  Indication: pulmonary embolus  Allergies  Allergen Reactions   Ciprofloxacin Other (See Comments)    Caused cdiff.   C-Diff    Patient Measurements: Height: 5' 6 (167.6 cm) Weight: 82.7 kg (182 lb 4.8 oz) IBW/kg (Calculated) : 59.3 HEPARIN  DW (KG): 76.7  Vital Signs: Temp: 98 F (36.7 C) (08/18 1941) Temp Source: Oral (08/18 1941) BP: 126/64 (08/18 2300) Pulse Rate: 70 (08/18 2300)  Labs: Recent Labs    07/01/24 1255 07/02/24 0448 07/02/24 1145 07/03/24 0328 07/03/24 1209 07/04/24 0040  HGB 14.9 13.6  --  13.2  --   --   HCT 43.1 40.5  --  38.7  --   --   PLT 197 183  --  183  --   --   HEPARINUNFRC  --   --   --  0.35 0.27* 0.33  CREATININE 0.94  --   --  1.08*  --   --   TROPONINIHS 12  --  10  --   --   --     Estimated Creatinine Clearance: 48.1 mL/min (A) (by C-G formula based on SCr of 1.08 mg/dL (H)).   Medical History: Past Medical History:  Diagnosis Date   Arthritis    C. difficile diarrhea    Hypertension    Thyroid  disease     Medications:  Scheduled:   atorvastatin   10 mg Oral Daily   azithromycin   500 mg Oral Daily   Chlorhexidine  Gluconate Cloth  6 each Topical Daily   escitalopram   10 mg Oral Daily   levothyroxine   150 mcg Oral Q0600   liothyronine   25 mcg Oral Daily   metoprolol  succinate  25 mg Oral Daily   polyethylene glycol  17 g Oral Daily   Infusions:   cefTRIAXone  (ROCEPHIN )  IV 1 g (07/03/24 1940)   heparin  1,300 Units/hr (07/03/24 1829)   PRN: hydrOXYzine , morphine  injection, ondansetron   Assessment: 78 yo female presenting with bilateral pulmonary emboli, left greater than right. No history of PTA anticoagulation noted. Baseline CBC stable. Pharmacy consulted for heparin  dosing.   Goal of Therapy:  Heparin  level 0.3-0.7 units/ml Monitor platelets by anticoagulation protocol: Yes   Plan:  8/19 0400 HL 0.33, therapeutic X 1 after restart Will  continue heparin  infusion rate at 1300 units/hr Recheck HL in 8 hrs to confirm CBC daily while on heparin   Rankin CANDIE Dills, PharmD, [facility] 07/04/2024 1:33 AM

## 2024-07-04 NOTE — Progress Notes (Signed)
 Progress Note    07/04/2024 12:57 PM 1 Day Post-Op  Subjective:  [name] is a 78 yo female now POD #1 from:  Procedure(s) Performed:             1.  Contrast injection right heart             2.  Mechanical thrombectomy to the right lower lobe pulmonary artery into the left upper lobe and lower lobe pulmonary arteries using the penumbra CAT 12 catheter             3.  Selective catheter placement left lower lobe and left upper lobe pulmonary artery             4.  Selective catheter placement right lower lobe, middle lobe, and upper lobe pulmonary arteries  Patient is resting comfortably in bed this morning and recovering as expected.  Patient remains on 2 L nasal cannula oxygen.  She does complain of right lower extremity pain.  She also states that she has been treated for arthritis in the leg for the last year.  Right lower extremity has +1 edema on exam explaining her symptoms.  No complaints overnight.  Vitals all remained stable.   Vitals:   07/04/24 1100 07/04/24 1200  BP: 125/71   Pulse: 66   Resp: (!) 25   Temp:  98.5 F (36.9 C)  SpO2: 93%    Physical Exam: Cardiac:  RRR, Normal S1, S2. No murmurs Lungs:  Clear throughout on auscultation, diminished in the bases, no rales rhonchi or wheezing noted.  Incisions:  Right groin puncture. Dressing clean dry and intact.  Extremities:  Palpable pulses throughout out. Noted +1 edema to right lower extremity.  Abdomen:  Positive bowel sounds throughout, soft non tender and non distended Neurologic: AAOX3 answers all questions and follows commands.   CBC    Component Value Date/Time   WBC 12.9 (H) 07/04/2024 0314   RBC 3.98 07/04/2024 0314   HGB 11.9 (L) 07/04/2024 0314   HGB 13.2 07/17/2013 0413   HCT 35.2 (L) 07/04/2024 0314   HCT 37.9 07/17/2013 0413   PLT 211 07/04/2024 0314   PLT 163 07/17/2013 0413   MCV 88.4 07/04/2024 0314   MCV 92 07/17/2013 0413   MCH 29.9 07/04/2024 0314   MCHC 33.8 07/04/2024 0314    RDW 12.5 07/04/2024 0314   RDW 13.1 07/17/2013 0413   LYMPHSABS 1.7 07/17/2013 0413   MONOABS 1.4 (H) 07/17/2013 0413   EOSABS 0.3 07/17/2013 0413   BASOSABS 0.0 07/17/2013 0413    BMET    Component Value Date/Time   NA 137 07/04/2024 0314   NA 146 (H) 07/17/2013 0413   K 3.7 07/04/2024 0314   K 3.5 07/17/2013 0413   CL 104 07/04/2024 0314   CL 116 (H) 07/17/2013 0413   CO2 24 07/04/2024 0314   CO2 25 07/17/2013 0413   GLUCOSE 108 (H) 07/04/2024 0314   GLUCOSE 89 07/17/2013 0413   BUN 24 (H) 07/04/2024 0314   BUN 11 07/17/2013 0413   CREATININE 0.88 07/04/2024 0314   CREATININE 1.02 07/17/2013 0413   CALCIUM  8.6 (L) 07/04/2024 0314   CALCIUM  8.0 (L) 07/17/2013 0413   GFRNONAA >60 07/04/2024 0314   GFRNONAA 58 (L) 07/17/2013 0413   GFRAA >60 07/17/2013 0413    INR No results found for: INR   Intake/Output Summary (Last 24 hours) at 07/04/2024 1257 Last data filed at 07/04/2024 1200 Gross per 24 hour  Intake 1384.43  ml  Output --  Net 1384.43 ml     Assessment/Plan:  78 y.o. female is s/p SEE ABOVE 1 Day Post-Op   PLAN  Patient has now postop day 1 from a pulmonary thrombectomy.  She is recovering as expected.  She remains on 2 L nasal cannula oxygen to be weaned today.  Patient continues on a heparin  infusion due to right lower extremity deep vein thrombosis.  The right femoral vein thrombosis is nonocclusive proximally and occlusive in the mid to distal femoral vein.  Right popliteal vein thrombus is nonocclusive proximally and occlusive in the mid to distal popliteal vein.  Nonocclusive thrombus is also identified in the right posterior tibial and peroneal veins.  Therefore vascular surgery recommends a right lower extremity venous thrombectomy.    Vascular surgery plans on taking the patient back to the vascular lab on 07/06/2024 for right lower extremity venous thrombectomy with possible intervention.  I discussed with the patient at the bedside this morning in  detail the procedure, benefits, risk, complications.  She verbalizes her understanding and wishes to proceed.  I answered all her questions today.  Patient will be made n.p.o. after midnight prior to procedure.  Patient will remain on heparin  infusion until procedure is completed.  I discussed the case in detail with Dr. Selinda Gu MD and he agrees with plan.  DVT prophylaxis: Heparin  infusion   [name] [facility] 07/04/2024 12:57 PM

## 2024-07-04 NOTE — Progress Notes (Signed)
 PHARMACY - ANTICOAGULATION CONSULT NOTE  Pharmacy Consult for IV Heparin  Indication: pulmonary embolus  Patient Measurements: Height: 5' 6 (167.6 cm) Weight: 82.7 kg (182 lb 4.8 oz) IBW/kg (Calculated) : 59.3 HEPARIN  DW (KG): 76.7  Labs: Recent Labs    07/01/24 1255 07/02/24 0448 07/02/24 0448 07/02/24 1145 07/03/24 0328 07/03/24 1209 07/04/24 0040 07/04/24 0314 07/04/24 0857  HGB 14.9 13.6  --   --  13.2  --   --  11.9*  --   HCT 43.1 40.5  --   --  38.7  --   --  35.2*  --   PLT 197 183  --   --  183  --   --  211  --   HEPARINUNFRC  --   --    < >  --  0.35 0.27* 0.33  --  0.35  CREATININE 0.94  --   --   --  1.08*  --   --  0.88  --   TROPONINIHS 12  --   --  10  --   --   --   --   --    < > = values in this interval not displayed.    Estimated Creatinine Clearance: 59 mL/min (by C-G formula based on SCr of 0.88 mg/dL).   Medical History: Past Medical History:  Diagnosis Date   Arthritis    C. difficile diarrhea    Hypertension    Thyroid  disease     Medications:  Scheduled:   atorvastatin   10 mg Oral Daily   cefUROXime   500 mg Oral BID WC   Chlorhexidine  Gluconate Cloth  6 each Topical Daily   escitalopram   10 mg Oral Daily   levothyroxine   150 mcg Oral Q0600   liothyronine   25 mcg Oral Daily   metoprolol  succinate  25 mg Oral Daily   polyethylene glycol  17 g Oral Daily   Infusions:   heparin  1,300 Units/hr (07/04/24 0800)   PRN: acetaminophen , hydrOXYzine , morphine  injection, ondansetron   Assessment: 78 yo female presenting with bilateral pulmonary emboli, left greater than right. No history of PTA anticoagulation noted. Baseline CBC stable. Pharmacy consulted for heparin  dosing.   Goal of Therapy:  Heparin  level 0.3-0.7 units/ml Monitor platelets by anticoagulation protocol: Yes   Plan:  --Heparin  level is therapeutic x 2 --Continue heparin  infusion at 1300 units/hr --Re-check HL and CBC tomorrow  Marolyn KATHEE Mare 07/04/2024 10:49 AM

## 2024-07-05 ENCOUNTER — Encounter
Admission: EM | Disposition: A | Discharge status: Home / Self Care | Payer: Self-pay | Source: Home / Self Care | Attending: Obstetrics and Gynecology

## 2024-07-05 ENCOUNTER — Encounter: Payer: Self-pay | Admitting: Vascular Surgery

## 2024-07-05 ENCOUNTER — Telehealth (HOSPITAL_COMMUNITY): Payer: Self-pay | Admitting: Pharmacy Technician

## 2024-07-05 ENCOUNTER — Other Ambulatory Visit (HOSPITAL_COMMUNITY): Payer: Self-pay

## 2024-07-05 DIAGNOSIS — I82431 Acute embolism and thrombosis of right popliteal vein: Secondary | ICD-10-CM

## 2024-07-05 DIAGNOSIS — Z9889 Other specified postprocedural states: Secondary | ICD-10-CM | POA: Diagnosis not present

## 2024-07-05 DIAGNOSIS — I2609 Other pulmonary embolism with acute cor pulmonale: Secondary | ICD-10-CM

## 2024-07-05 DIAGNOSIS — I2699 Other pulmonary embolism without acute cor pulmonale: Secondary | ICD-10-CM | POA: Diagnosis not present

## 2024-07-05 DIAGNOSIS — I82411 Acute embolism and thrombosis of right femoral vein: Secondary | ICD-10-CM | POA: Diagnosis not present

## 2024-07-05 HISTORY — PX: PERIPHERAL VASCULAR THROMBECTOMY: CATH118306

## 2024-07-05 LAB — CBC
HCT: 33.3 % — ABNORMAL LOW (ref 36.0–46.0)
Hemoglobin: 11.1 g/dL — ABNORMAL LOW (ref 12.0–15.0)
MCH: 30.1 pg (ref 26.0–34.0)
MCHC: 33.3 g/dL (ref 30.0–36.0)
MCV: 90.2 fL (ref 80.0–100.0)
Platelets: 222 K/uL (ref 150–400)
RBC: 3.69 MIL/uL — ABNORMAL LOW (ref 3.87–5.11)
RDW: 12.6 % (ref 11.5–15.5)
WBC: 9.3 K/uL (ref 4.0–10.5)
nRBC: 0 % (ref 0.0–0.2)

## 2024-07-05 LAB — HEPARIN LEVEL (UNFRACTIONATED): Heparin Unfractionated: 0.35 [IU]/mL (ref 0.30–0.70)

## 2024-07-05 SURGERY — PERIPHERAL VASCULAR THROMBECTOMY
Anesthesia: Moderate Sedation | Laterality: Right

## 2024-07-05 MED ORDER — HEPARIN SODIUM (PORCINE) 1000 UNIT/ML IJ SOLN
INTRAMUSCULAR | Status: AC
  Filled 2024-07-05: qty 10

## 2024-07-05 MED ORDER — METHYLPREDNISOLONE SODIUM SUCC 125 MG IJ SOLR: 125.0000 mg | Freq: Once | INTRAMUSCULAR | Status: DC | PRN

## 2024-07-05 MED ORDER — CEFAZOLIN SODIUM-DEXTROSE 2-4 GM/100ML-% IV SOLN
2.0000 g | INTRAVENOUS | Status: AC
  Administered 2024-07-05: 2 g via INTRAVENOUS

## 2024-07-05 MED ORDER — SODIUM CHLORIDE 0.9 % IV SOLN: INTRAVENOUS | Status: DC

## 2024-07-05 MED ORDER — DIPHENHYDRAMINE HCL 50 MG/ML IJ SOLN: 50.0000 mg | Freq: Once | INTRAMUSCULAR | Status: DC | PRN

## 2024-07-05 MED ORDER — LIDOCAINE-EPINEPHRINE (PF) 1 %-1:200000 IJ SOLN
INTRAMUSCULAR | Status: DC | PRN
  Administered 2024-07-05: 10 mL

## 2024-07-05 MED ORDER — FENTANYL CITRATE (PF) 100 MCG/2ML IJ SOLN
INTRAMUSCULAR | Status: DC | PRN
  Administered 2024-07-05: 50 ug via INTRAVENOUS

## 2024-07-05 MED ORDER — MIDAZOLAM HCL 2 MG/2ML IJ SOLN
INTRAMUSCULAR | Status: DC | PRN
Start: 2024-07-05 — End: 2024-07-05
  Administered 2024-07-05: 2 mg via INTRAVENOUS

## 2024-07-05 MED ORDER — FAMOTIDINE 20 MG PO TABS: 40.0000 mg | ORAL_TABLET | Freq: Once | ORAL | Status: DC | PRN

## 2024-07-05 MED ORDER — FENTANYL CITRATE (PF) 100 MCG/2ML IJ SOLN
INTRAMUSCULAR | Status: AC
  Filled 2024-07-05: qty 2

## 2024-07-05 MED ORDER — HEPARIN SODIUM (PORCINE) 1000 UNIT/ML IJ SOLN
INTRAMUSCULAR | Status: DC | PRN
  Administered 2024-07-05: 3000 [IU] via INTRAVENOUS

## 2024-07-05 MED ORDER — HEPARIN (PORCINE) IN NACL 1000-0.9 UT/500ML-% IV SOLN
INTRAVENOUS | Status: DC | PRN
  Administered 2024-07-05: 1000 mL

## 2024-07-05 MED ORDER — MIDAZOLAM HCL 5 MG/5ML IJ SOLN
INTRAMUSCULAR | Status: AC
  Filled 2024-07-05: qty 5

## 2024-07-05 MED ORDER — IODIXANOL 320 MG/ML IV SOLN
INTRAVENOUS | Status: DC | PRN
  Administered 2024-07-05: 35 mL

## 2024-07-05 MED ORDER — MIDAZOLAM HCL 2 MG/ML PO SYRP: 8.0000 mg | ORAL_SOLUTION | Freq: Once | ORAL | Status: DC | PRN

## 2024-07-05 MED ORDER — CEFAZOLIN SODIUM-DEXTROSE 2-4 GM/100ML-% IV SOLN
INTRAVENOUS | Status: AC
  Filled 2024-07-05: qty 100

## 2024-07-05 SURGICAL SUPPLY — 10 items
CANISTER PENUMBRA ENGINE (MISCELLANEOUS) IMPLANT
CATH LIGHTNI FLASH 16XTORQ 100 (CATHETERS) IMPLANT
CLOSURE PERCLOSE PROSTYLE (VASCULAR PRODUCTS) IMPLANT
COVER PROBE ULTRASOUND 5X96 (MISCELLANEOUS) IMPLANT
KIT MICROPUNCTURE VSI 5F STIFF (SHEATH) IMPLANT
PACK ANGIOGRAPHY (CUSTOM PROCEDURE TRAY) ×1 IMPLANT
SHEATH BRITE TIP 6FRX11 (SHEATH) IMPLANT
SHEATH INTRO CHECKFLO 16F 13 (SHEATH) IMPLANT
SUT MNCRL AB 4-0 PS2 18 (SUTURE) IMPLANT
WIRE J 3MM .035X145CM (WIRE) IMPLANT

## 2024-07-05 NOTE — H&P (View-Only) (Signed)
 Progress Note    07/05/2024 7:55 AM 2 Days Post-Op  Subjective:  Mika Griffitts is a 78 yo female now POD #2 from:   Procedure(s) Performed:             1.  Contrast injection right heart             2.  Mechanical thrombectomy to the right lower lobe pulmonary artery into the left upper lobe and lower lobe pulmonary arteries using the penumbra CAT 12 catheter             3.  Selective catheter placement left lower lobe and left upper lobe pulmonary artery             4.  Selective catheter placement right lower lobe, middle lobe, and upper lobe pulmonary arteries   Patient is resting comfortably in bed this morning and recovering as expected.  Patient remains on 1 L nasal cannula oxygen.  She does complain of right lower extremity pain.  She also states that she has been treated for arthritis in the leg for the last year.  Right lower extremity has +1 edema on exam explaining her symptoms.  No complaints overnight.  Vitals all remained stable  Vitals:   07/05/24 0522 07/05/24 0600  BP: 135/66 122/80  Pulse: 69 60  Resp: (!) 24 16  Temp:    SpO2: 95% 95%   Physical Exam: Cardiac:  RRR, Normal S1, S2. No murmurs Lungs:  Clear throughout on auscultation, diminished in the bases, no rales rhonchi or wheezing noted.  Incisions:  Right groin puncture. Dressing clean dry and intact.  Extremities:  Palpable pulses throughout out. Noted +1 edema to right lower extremity.  Abdomen:  Positive bowel sounds throughout, soft non tender and non distended Neurologic: AAOX3 answers all questions and follows commands.   CBC    Component Value Date/Time   WBC 9.3 07/05/2024 0603   RBC 3.69 (L) 07/05/2024 0603   HGB 11.1 (L) 07/05/2024 0603   HGB 13.2 07/17/2013 0413   HCT 33.3 (L) 07/05/2024 0603   HCT 37.9 07/17/2013 0413   PLT 222 07/05/2024 0603   PLT 163 07/17/2013 0413   MCV 90.2 07/05/2024 0603   MCV 92 07/17/2013 0413   MCH 30.1 07/05/2024 0603   MCHC 33.3 07/05/2024 0603   RDW  12.6 07/05/2024 0603   RDW 13.1 07/17/2013 0413   LYMPHSABS 1.7 07/17/2013 0413   MONOABS 1.4 (H) 07/17/2013 0413   EOSABS 0.3 07/17/2013 0413   BASOSABS 0.0 07/17/2013 0413    BMET    Component Value Date/Time   NA 137 07/04/2024 0314   NA 146 (H) 07/17/2013 0413   K 3.7 07/04/2024 0314   K 3.5 07/17/2013 0413   CL 104 07/04/2024 0314   CL 116 (H) 07/17/2013 0413   CO2 24 07/04/2024 0314   CO2 25 07/17/2013 0413   GLUCOSE 108 (H) 07/04/2024 0314   GLUCOSE 89 07/17/2013 0413   BUN 24 (H) 07/04/2024 0314   BUN 11 07/17/2013 0413   CREATININE 0.88 07/04/2024 0314   CREATININE 1.02 07/17/2013 0413   CALCIUM  8.6 (L) 07/04/2024 0314   CALCIUM  8.0 (L) 07/17/2013 0413   GFRNONAA >60 07/04/2024 0314   GFRNONAA 58 (L) 07/17/2013 0413   GFRAA >60 07/17/2013 0413    INR No results found for: INR   Intake/Output Summary (Last 24 hours) at 07/05/2024 0755 Last data filed at 07/05/2024 0543 Gross per 24 hour  Intake 785.16 ml  Output --  Net 785.16 ml     Assessment/Plan:  78 y.o. female is s/p SEE ABOVE 2 Days Post-Op   PLAN  Patient has now postop day #1 from a pulmonary thrombectomy.  She is recovering as expected.  She remains on 1 L nasal cannula oxygen to be weaned today.  Patient continues on a heparin  infusion due to right lower extremity deep vein thrombosis.  The right femoral vein thrombosis is nonocclusive proximally and occlusive in the mid to distal femoral vein.  Right popliteal vein thrombus is nonocclusive proximally and occlusive in the mid to distal popliteal vein.  Nonocclusive thrombus is also identified in the right posterior tibial and peroneal veins.  Therefore vascular surgery recommends a right lower extremity venous thrombectomy.     Vascular surgery plans on taking the patient back to the vascular lab on today 07/05/2024 for right lower extremity venous thrombectomy with possible intervention.  I discussed with the patient at the bedside this morning in  detail the procedure, benefits, risk, complications.  She verbalizes her understanding and wishes to proceed.  I answered all her questions today.  Patient will be made n.p.o. after midnight prior to procedure.  Patient will remain on heparin  infusion until procedure is completed.   I discussed the case in detail with Dr. Selinda Gu MD and he agrees with plan.   DVT prophylaxis: Heparin  infusion   [name] [facility] 07/05/2024 7:55 AM

## 2024-07-05 NOTE — Progress Notes (Signed)
 PHARMACY - ANTICOAGULATION CONSULT NOTE  Pharmacy Consult for IV Heparin  Indication: pulmonary embolus  Patient Measurements: Height: 5' 6 (167.6 cm) Weight: 82.7 kg (182 lb 4.8 oz) IBW/kg (Calculated) : 59.3 HEPARIN  DW (KG): 76.7  Labs: Recent Labs     0000 07/02/24 1145 07/03/24 0328 07/03/24 1209 07/04/24 0040 07/04/24 0314 07/04/24 0857 07/05/24 0603  HGB   < >  --  13.2  --   --  11.9*  --  11.1*  HCT  --   --  38.7  --   --  35.2*  --  33.3*  PLT  --   --  183  --   --  211  --  222  HEPARINUNFRC  --   --  0.35   < > 0.33  --  0.35 0.35  CREATININE  --   --  1.08*  --   --  0.88  --   --   TROPONINIHS  --  10  --   --   --   --   --   --    < > = values in this interval not displayed.    Estimated Creatinine Clearance: 59 mL/min (by C-G formula based on SCr of 0.88 mg/dL).   Medical History: Past Medical History:  Diagnosis Date   Arthritis    C. difficile diarrhea    Hypertension    Thyroid  disease     Medications:  Scheduled:   atorvastatin   10 mg Oral Daily   cefUROXime   500 mg Oral BID WC   Chlorhexidine  Gluconate Cloth  6 each Topical Daily   escitalopram   10 mg Oral Daily   levothyroxine   150 mcg Oral Q0600   liothyronine   25 mcg Oral Daily   metoprolol  succinate  25 mg Oral Daily   polyethylene glycol  17 g Oral Daily   Infusions:   sodium chloride       ceFAZolin  (ANCEF ) IV     heparin  1,300 Units/hr (07/05/24 0543)   PRN: acetaminophen , diphenhydrAMINE , famotidine , hydrOXYzine , methylPREDNISolone  (SOLU-MEDROL ) injection, midazolam , morphine  injection, ondansetron   Assessment: 78 yo female presenting with bilateral pulmonary emboli, left greater than right. No history of PTA anticoagulation noted. Baseline CBC stable. Pharmacy consulted for heparin  dosing.   Goal of Therapy:  Heparin  level 0.3-0.7 units/ml Monitor platelets by anticoagulation protocol: Yes   Plan:  --Heparin  level is therapeutic x 3 --Continue heparin  infusion at  1300 units/hr --Re-check HL and CBC tomorrow  Rankin CANDIE Dills, PharmD, [facility] 07/05/2024 6:41 AM

## 2024-07-05 NOTE — Care Management Important Message (Signed)
 Important Message  Patient Details  Name: [name] MRN: [id number] Date of Birth: 25-Feb-1946   Important Message Given:  Yes - Medicare IM     [name] 07/05/2024, 4:33 PM

## 2024-07-05 NOTE — Progress Notes (Signed)
 Progress Note   Patient: [name] FMW:[id number] DOB: 1946/02/14 DOA: 07/01/2024     3 DOS: the patient was seen and examined on 07/05/2024 at 8:20AM      Brief hospital course: [name] with HTN, HLD, hypothyroidism who presented with chest pain.  Initially admitted for pneumonia, then found to have PE.  Right lower extremity DVT  Vascular surgery consulted for thrombectomy.     Assessment and Plan: Acute pulmonary embolism Right lower extremity DVT CTA confirmed large PE, s/p thrombectomy 8/19 for this S/p thrombectomy 8/20 for RLE DVT  - Continue heparin  gtt - Post-thrombectomy care per [facility]   Possible community acquired pneumonia Procalcitonin mildly elevated.  Complete course of antibiotics.   Normocytic anemia  Hgb WNL on presentation.  No gross bleeding.   14 on admission now 11.9.  Check iron panel.  Continue to monitor for bleeding.    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  Not on diuretic as an outpatient. -Continue metoprolol  - Hold losartan - Continue Lipitor  Hypothyroidism -Continue levothyroxine  and liothyronine   Anxiety -Continue Lexapro         Subjective: Continues to have some chest discomfort.  No issues after her right lower extremity thrombectomy    Physical Exam: BP (!) 140/78 (BP Location: Left Arm)   Pulse 73   Temp (!) 97.3 F (36.3 C)   Resp (!) 22   Ht 5' 6 (1.676 m)   Wt 82.7 kg   SpO2 96%   BMI 29.42 kg/m   Physical Exam  Constitutional: In no distress.  Cardiovascular: Normal rate, regular rhythm. No lower extremity edema  Pulmonary: Non labored breathing on room air, no wheezing or rales.   Abdominal: Soft. Normal bowel sounds. Non distended and non tender Musculoskeletal: Normal range of motion.     Neurological: Alert and oriented to person, place, and time. Non focal  Skin: Skin  is warm and dry.  Right lower extremity bandage clean dry and intact     Data Reviewed:    Latest Ref Rng & Units 07/05/2024    6:03 AM 07/04/2024    3:14 AM 07/03/2024    3:28 AM  CBC  WBC 4.0 - 10.5 K/uL 9.3  12.9  14.2   Hemoglobin 12.0 - 15.0 g/dL 88.8  88.0  86.7   Hematocrit 36.0 - 46.0 % 33.3  35.2  38.7   Platelets 150 - 400 K/uL 222  211  183       Latest Ref Rng & Units 07/04/2024    3:14 AM 07/03/2024    3:28 AM 07/01/2024   12:55 PM  BMP  Glucose 70 - 99 mg/dL 891  895  880   BUN 8 - 23 mg/dL 24  24  23    Creatinine 0.44 - 1.00 mg/dL 9.11  8.91  9.05   Sodium 135 - 145 mmol/L 137  140  136   Potassium 3.5 - 5.1 mmol/L 3.7  4.0  4.0   Chloride 98 - 111 mmol/L 104  106  103   CO2 22 - 32 mmol/L 24  25  21    Calcium  8.9 - 10.3 mg/dL 8.6  8.7  9.1       Family Communication: called to husband and daughter no answer    Disposition: Status is: Inpatient 78 yo F admitted with chest pain and dyspnea, initially thought to have  pneumonia, then found to have large PE, taken for thrombectomy 8/19. Found to have extensive RLE DVT requiring mechanical thrombectomy 8/20. Remains hospitalized for further monitoring. .  Will continue heparin  gtt, transition to oral anticoagulation as soon as able.        Author: Alban Pepper, MD 07/05/2024 7:52 PM  For on call review [email or web address].

## 2024-07-05 NOTE — Progress Notes (Signed)
 Patient transferred. AxOx4. VSS. All patient belongings kept at bedside transferred with patient.

## 2024-07-05 NOTE — Progress Notes (Signed)
 Took over patient from [employer]. Questions answered. Introduced self to patient, updated board. Pt is alert and oriented, NAD. Pt is resting, denies needs. Right groin site checked and appears WNL. Pt educated to call RN if she needs anything. Pt verbalizes understanding.

## 2024-07-05 NOTE — Telephone Encounter (Signed)
 Patient Product/process development scientist completed.    The patient is insured through [facility]. Patient has Medicare and is not eligible for a copay card, but may be able to apply for patient assistance or Medicare RX Payment Plan (Patient Must reach out to their plan, if eligible for payment plan), if available.    Ran test claim for Eliquis  5 mg and the current 30 day co-pay is $251.35 due to a deductible.  Will be $47.00 once deductible is met.  Ran test claim for Xarelto 20 mg and the current 30 day co-pay is $251.35 due to a deductible.  Will be $47.00 once deductible is met.  This test claim was processed through [facility]- copay amounts may vary at other pharmacies due to pharmacy/plan contracts, or as the patient moves through the different stages of their insurance plan.     Reyes Sharps, CPHT Pharmacy Technician III Certified Patient Advocate [facility] Patient Advocate Team Direct Number: [phone]  Fax: [phone]

## 2024-07-05 NOTE — Op Note (Signed)
 [facility]   OPERATIVE NOTE   PRE-OPERATIVE DIAGNOSIS: extensive right lower extremity DVT  POST-OPERATIVE DIAGNOSIS: same   PROCEDURE: 1.   US  guidance for vascular access to right popliteal vein 2.   Catheter placement into right external iliac vein from right popliteal approach 3.   IVC gram and right lower extremity venogram 4.   Mechanical thrombectomy to right popliteal, superficial femoral, and common femoral vein with the penumbra 16 flash device     SURGEON: Selinda Gu, MD  ASSISTANT(S): none  ANESTHESIA: local with moderate conscious sedation for 19 minutes using 2 mg of Versed  and 50 mcg of Fentanyl   ESTIMATED BLOOD LOSS: 150 cc  FINDING(S): 1.  Extensive thrombus from the right popliteal vein up to the common femoral vein.  Iliac veins were small and relatively underperfused but not stenotic and no thrombus.  IVC was patent.  SPECIMEN(S):  none  INDICATIONS:    Patient is a 78 y.o. female who presents with DVT and PE.  She is already undergone pulmonary thrombectomy.  She still has residual significant right lower extremity DVT.  Patient has marked leg swelling and pain.  Venous intervention is performed to reduce the symtpoms and avoid long term postphlebitic symptoms.    DESCRIPTION: After obtaining full informed written consent, the patient was brought back to the vascular suite and placed supine upon the table. Moderate conscious sedation was administered during a face to face encounter with the patient throughout the procedure with my supervision of the RN administering medicines and monitoring the patient's vital signs, pulse oximetry, telemetry and mental status throughout from the start of the procedure until the patient was taken to the recovery room.  After obtaining adequate anesthesia, the patient was prepped and draped in the standard fashion.    The patient was then placed into the prone position.  The right popliteal vein was then  accessed under direct ultrasound guidance without difficulty with a micropuncture needle and a permanent image was recorded.  I then upsized to an 16Fr sheath over a J wire after placing a Pro-glide device.  3000 units of heparin  were then given.  Imaging showed extensive DVT with minimal flow.  Once we got up into the external iliac vein, the iliac vein was small and underperfused but not stenotic and not thrombotic.  The IVC was also patent.  I used the Penumbra Cat 16 flash catheter and evacuated about 150 cc of effluent with mechanical thrombectomy throughout the  CFV, SFV, and popliteal vein.  This resulted in marked improvement with no significant residual thrombus after thrombectomy.  I then elected to terminate the procedure.  The sheath was removed, the Pro-glide was secured, and a dressing was placed.  She was taken to the recovery room in stable condition having tolerated the procedure well.    COMPLICATIONS: None  CONDITION: Stable  Tykisha Areola 07/05/2024 9:00 AM

## 2024-07-05 NOTE — Progress Notes (Signed)
 Progress Note    07/05/2024 7:55 AM 2 Days Post-Op  Subjective:  Mika Griffitts is a 78 yo female now POD #2 from:   Procedure(s) Performed:             1.  Contrast injection right heart             2.  Mechanical thrombectomy to the right lower lobe pulmonary artery into the left upper lobe and lower lobe pulmonary arteries using the penumbra CAT 12 catheter             3.  Selective catheter placement left lower lobe and left upper lobe pulmonary artery             4.  Selective catheter placement right lower lobe, middle lobe, and upper lobe pulmonary arteries   Patient is resting comfortably in bed this morning and recovering as expected.  Patient remains on 1 L nasal cannula oxygen.  She does complain of right lower extremity pain.  She also states that she has been treated for arthritis in the leg for the last year.  Right lower extremity has +1 edema on exam explaining her symptoms.  No complaints overnight.  Vitals all remained stable  Vitals:   07/05/24 0522 07/05/24 0600  BP: 135/66 122/80  Pulse: 69 60  Resp: (!) 24 16  Temp:    SpO2: 95% 95%   Physical Exam: Cardiac:  RRR, Normal S1, S2. No murmurs Lungs:  Clear throughout on auscultation, diminished in the bases, no rales rhonchi or wheezing noted.  Incisions:  Right groin puncture. Dressing clean dry and intact.  Extremities:  Palpable pulses throughout out. Noted +1 edema to right lower extremity.  Abdomen:  Positive bowel sounds throughout, soft non tender and non distended Neurologic: AAOX3 answers all questions and follows commands.   CBC    Component Value Date/Time   WBC 9.3 07/05/2024 0603   RBC 3.69 (L) 07/05/2024 0603   HGB 11.1 (L) 07/05/2024 0603   HGB 13.2 07/17/2013 0413   HCT 33.3 (L) 07/05/2024 0603   HCT 37.9 07/17/2013 0413   PLT 222 07/05/2024 0603   PLT 163 07/17/2013 0413   MCV 90.2 07/05/2024 0603   MCV 92 07/17/2013 0413   MCH 30.1 07/05/2024 0603   MCHC 33.3 07/05/2024 0603   RDW  12.6 07/05/2024 0603   RDW 13.1 07/17/2013 0413   LYMPHSABS 1.7 07/17/2013 0413   MONOABS 1.4 (H) 07/17/2013 0413   EOSABS 0.3 07/17/2013 0413   BASOSABS 0.0 07/17/2013 0413    BMET    Component Value Date/Time   NA 137 07/04/2024 0314   NA 146 (H) 07/17/2013 0413   K 3.7 07/04/2024 0314   K 3.5 07/17/2013 0413   CL 104 07/04/2024 0314   CL 116 (H) 07/17/2013 0413   CO2 24 07/04/2024 0314   CO2 25 07/17/2013 0413   GLUCOSE 108 (H) 07/04/2024 0314   GLUCOSE 89 07/17/2013 0413   BUN 24 (H) 07/04/2024 0314   BUN 11 07/17/2013 0413   CREATININE 0.88 07/04/2024 0314   CREATININE 1.02 07/17/2013 0413   CALCIUM  8.6 (L) 07/04/2024 0314   CALCIUM  8.0 (L) 07/17/2013 0413   GFRNONAA >60 07/04/2024 0314   GFRNONAA 58 (L) 07/17/2013 0413   GFRAA >60 07/17/2013 0413    INR No results found for: INR   Intake/Output Summary (Last 24 hours) at 07/05/2024 0755 Last data filed at 07/05/2024 0543 Gross per 24 hour  Intake 785.16 ml  Output --  Net 785.16 ml     Assessment/Plan:  78 y.o. female is s/p SEE ABOVE 2 Days Post-Op   PLAN  Patient has now postop day #1 from a pulmonary thrombectomy.  She is recovering as expected.  She remains on 1 L nasal cannula oxygen to be weaned today.  Patient continues on a heparin  infusion due to right lower extremity deep vein thrombosis.  The right femoral vein thrombosis is nonocclusive proximally and occlusive in the mid to distal femoral vein.  Right popliteal vein thrombus is nonocclusive proximally and occlusive in the mid to distal popliteal vein.  Nonocclusive thrombus is also identified in the right posterior tibial and peroneal veins.  Therefore vascular surgery recommends a right lower extremity venous thrombectomy.     Vascular surgery plans on taking the patient back to the vascular lab on today 07/05/2024 for right lower extremity venous thrombectomy with possible intervention.  I discussed with the patient at the bedside this morning in  detail the procedure, benefits, risk, complications.  She verbalizes her understanding and wishes to proceed.  I answered all her questions today.  Patient will be made n.p.o. after midnight prior to procedure.  Patient will remain on heparin  infusion until procedure is completed.   I discussed the case in detail with Dr. Selinda Gu MD and he agrees with plan.   DVT prophylaxis: Heparin  infusion   [name] [facility] 07/05/2024 7:55 AM

## 2024-07-05 NOTE — Interval H&P Note (Signed)
 History and Physical Interval Note:  07/05/2024 8:20 AM  [name]  has presented today for surgery, with the diagnosis of Deep Vein Thrombosis.  The various methods of treatment have been discussed with the patient and family. After consideration of risks, benefits and other options for treatment, the patient has consented to  Procedure(s): PERIPHERAL VASCULAR THROMBECTOMY (Right) as a surgical intervention.  The patient's history has been reviewed, patient examined, no change in status, stable for surgery.  I have reviewed the patient's chart and labs.  Questions were answered to the patient's satisfaction.     Christen Wardrop

## 2024-07-06 ENCOUNTER — Other Ambulatory Visit: Payer: Self-pay

## 2024-07-06 DIAGNOSIS — I82411 Acute embolism and thrombosis of right femoral vein: Secondary | ICD-10-CM | POA: Diagnosis not present

## 2024-07-06 DIAGNOSIS — I2699 Other pulmonary embolism without acute cor pulmonale: Secondary | ICD-10-CM | POA: Diagnosis not present

## 2024-07-06 DIAGNOSIS — Z9889 Other specified postprocedural states: Secondary | ICD-10-CM | POA: Diagnosis not present

## 2024-07-06 LAB — BASIC METABOLIC PANEL WITH GFR
Anion gap: 7 (ref 5–15)
BUN: 21 mg/dL (ref 8–23)
CO2: 25 mmol/L (ref 22–32)
Calcium: 8.5 mg/dL — ABNORMAL LOW (ref 8.9–10.3)
Chloride: 108 mmol/L (ref 98–111)
Creatinine, Ser: 0.86 mg/dL (ref 0.44–1.00)
GFR, Estimated: >60 mL/min (ref >60)
Glucose, Bld: 102 mg/dL — ABNORMAL HIGH (ref 70–99)
Potassium: 3.9 mmol/L (ref 3.5–5.1)
Sodium: 140 mmol/L (ref 135–145)

## 2024-07-06 LAB — CBC
HCT: 32.1 % — ABNORMAL LOW (ref 36.0–46.0)
Hemoglobin: 10.7 g/dL — ABNORMAL LOW (ref 12.0–15.0)
MCH: 29.7 pg (ref 26.0–34.0)
MCHC: 33.3 g/dL (ref 30.0–36.0)
MCV: 89.2 fL (ref 80.0–100.0)
Platelets: 257 K/uL (ref 150–400)
RBC: 3.6 MIL/uL — ABNORMAL LOW (ref 3.87–5.11)
RDW: 12.7 % (ref 11.5–15.5)
WBC: 9.3 K/uL (ref 4.0–10.5)
nRBC: 0 % (ref 0.0–0.2)

## 2024-07-06 LAB — HEPARIN LEVEL (UNFRACTIONATED): Heparin Unfractionated: 0.29 [IU]/mL — ABNORMAL LOW (ref 0.30–0.70)

## 2024-07-06 LAB — IRON AND TIBC
Iron: 49 ug/dL (ref 28–170)
Saturation Ratios: 28 % (ref 10.4–31.8)
TIBC: 175 ug/dL — ABNORMAL LOW (ref 250–450)
UIBC: 126 ug/dL

## 2024-07-06 LAB — FERRITIN: Ferritin: 643 ng/mL — ABNORMAL HIGH (ref 11–307)

## 2024-07-06 MED ORDER — HEPARIN BOLUS VIA INFUSION
1200.0000 [IU] | Freq: Once | INTRAVENOUS | Status: DC
  Filled 2024-07-06: qty 1200

## 2024-07-06 MED ORDER — APIXABAN 5 MG PO TABS
5.0000 mg | ORAL_TABLET | Freq: Two times a day (BID) | ORAL | 5 refills | Status: AC
  Filled 2024-07-06: qty 60, 30d supply, fill #0

## 2024-07-06 MED ORDER — CEFUROXIME AXETIL 500 MG PO TABS
500.0000 mg | ORAL_TABLET | Freq: Two times a day (BID) | ORAL | 0 refills | Status: AC
  Filled 2024-07-06: qty 4, 2d supply, fill #0

## 2024-07-06 MED ORDER — HEPARIN BOLUS VIA INFUSION
1200.0000 [IU] | Freq: Once | INTRAVENOUS | Status: AC
  Administered 2024-07-06: 1200 [IU] via INTRAVENOUS
  Filled 2024-07-06: qty 1200

## 2024-07-06 MED ORDER — APIXABAN 5 MG PO TABS
5.0000 mg | ORAL_TABLET | Freq: Two times a day (BID) | ORAL | Status: DC
  Administered 2024-07-06: 5 mg via ORAL
  Filled 2024-07-06: qty 1

## 2024-07-06 NOTE — Progress Notes (Signed)
 1320: D/C instructions completed and reviewed with patient. Patient verbalized understanding of instructions given. D/C to home in stable condition with family member. VSS. No distress.

## 2024-07-06 NOTE — Plan of Care (Signed)

## 2024-07-06 NOTE — Progress Notes (Signed)
 Progress Note    07/06/2024 8:44 AM 1 Day Post-Op  Subjective:  [name] is a 78 yo female now POD #2 from:    Procedure(s) Performed:             1.  Contrast injection right heart             2.  Mechanical thrombectomy to the right lower lobe pulmonary artery into the left upper lobe and lower lobe pulmonary arteries using the penumbra CAT 12 catheter             3.  Selective catheter placement left lower lobe and left upper lobe pulmonary artery             4.  Selective catheter placement right lower lobe, middle lobe, and upper lobe pulmonary arteries  AND  POD #1 from:  PROCEDURE: 1.   US  guidance for vascular access to right popliteal vein 2.   Catheter placement into right external iliac vein from right popliteal approach 3.   IVC gram and right lower extremity venogram 4.   Mechanical thrombectomy to right popliteal, superficial femoral, and common femoral vein with the penumbra 16 flash device  Patient is resting comfortably in bed this morning.  She is not requiring any supplemental oxygen.  Right lower extremity is wrapped post thrombectomy.  She states her right lower extremity feels better today.  Patient has warm feet bilaterally and no distress breathing.  No complaints overnight vitals are remained stable.  Vitals:   07/05/24 2316 07/06/24 0447  BP: 131/63 125/71  Pulse: 72 70  Resp: 18 18  Temp: 98.9 F (37.2 C) 98.4 F (36.9 C)  SpO2: 95% 97%   Physical Exam: Cardiac:  RRR, Normal S1, S2. No murmurs Lungs:  Clear throughout on auscultation, diminished in the bases, no rales rhonchi or wheezing noted.  Incisions:  Right groin puncture. Dressing clean dry and intact. Right popliteal vein puncture. Dressing clean dry and intact without any drainage to note.  Extremities:  Palpable pulses throughout out. Noted +1 edema to right lower extremity.  Abdomen:  Positive bowel sounds throughout, soft non tender and non distended Neurologic: AAOX3 answers all  questions and follows commands.   CBC    Component Value Date/Time   WBC 9.3 07/06/2024 0524   RBC 3.60 (L) 07/06/2024 0524   HGB 10.7 (L) 07/06/2024 0524   HGB 13.2 07/17/2013 0413   HCT 32.1 (L) 07/06/2024 0524   HCT 37.9 07/17/2013 0413   PLT 257 07/06/2024 0524   PLT 163 07/17/2013 0413   MCV 89.2 07/06/2024 0524   MCV 92 07/17/2013 0413   MCH 29.7 07/06/2024 0524   MCHC 33.3 07/06/2024 0524   RDW 12.7 07/06/2024 0524   RDW 13.1 07/17/2013 0413   LYMPHSABS 1.7 07/17/2013 0413   MONOABS 1.4 (H) 07/17/2013 0413   EOSABS 0.3 07/17/2013 0413   BASOSABS 0.0 07/17/2013 0413    BMET    Component Value Date/Time   NA 140 07/06/2024 0524   NA 146 (H) 07/17/2013 0413   K 3.9 07/06/2024 0524   K 3.5 07/17/2013 0413   CL 108 07/06/2024 0524   CL 116 (H) 07/17/2013 0413   CO2 25 07/06/2024 0524   CO2 25 07/17/2013 0413   GLUCOSE 102 (H) 07/06/2024 0524   GLUCOSE 89 07/17/2013 0413   BUN 21 07/06/2024 0524   BUN 11 07/17/2013 0413   CREATININE 0.86 07/06/2024 0524   CREATININE 1.02 07/17/2013 0413  CALCIUM  8.5 (L) 07/06/2024 0524   CALCIUM  8.0 (L) 07/17/2013 0413   GFRNONAA >60 07/06/2024 0524   GFRNONAA 58 (L) 07/17/2013 0413   GFRAA >60 07/17/2013 0413    INR No results found for: INR   Intake/Output Summary (Last 24 hours) at 07/06/2024 0844 Last data filed at 07/06/2024 0329 Gross per 24 hour  Intake 266.57 ml  Output --  Net 266.57 ml     Assessment/Plan:  78 y.o. female is s/p pulmonary thrombectomy postop day 2 right lower extremity thrombectomy postop day 1.  1 Day Post-Op   PLAN Patient is recovering as expected.  Patient was not requiring any oxygen this morning.  She states that she is ambulating around the room without any difficulties.  Right lower extremity remains wrapped and needs to be wrapped for another 24 hours.  Patient was instructed she can take the wrapping off tomorrow morning and then shower when she gets home. Stop the heparin   infusion today and start the patient on Eliquis  5 mg twice daily indefinitely.  She does not need a loading dose of Eliquis  due to the extension of the use of heparin  for both procedures. Okay per vascular surgery for patient to be discharged home today on Eliquis  5 mg twice daily.  Patient will follow-up in vein and vascular clinic in 6 weeks.   DVT prophylaxis: Heparin  infusion to be converted to oral Eliquis  5 mg twice daily.   [name] [facility] 07/06/2024 8:44 AM

## 2024-07-06 NOTE — Progress Notes (Signed)
 PHARMACY - ANTICOAGULATION CONSULT NOTE  Pharmacy Consult for IV Heparin  Indication: pulmonary embolus  Patient Measurements: Height: 5' 6 (167.6 cm) Weight: 82.7 kg (182 lb 4.8 oz) IBW/kg (Calculated) : 59.3 HEPARIN  DW (KG): 76.7  Labs: Recent Labs    07/04/24 0314 07/04/24 0857 07/05/24 0603 07/06/24 0524  HGB 11.9*  --  11.1* 10.7*  HCT 35.2*  --  33.3* 32.1*  PLT 211  --  222 257  HEPARINUNFRC  --  0.35 0.35 0.29*  CREATININE 0.88  --   --   --     Estimated Creatinine Clearance: 59 mL/min (by C-G formula based on SCr of 0.88 mg/dL).   Medical History: Past Medical History:  Diagnosis Date   Arthritis    C. difficile diarrhea    Hypertension    Thyroid  disease     Medications:  Scheduled:   atorvastatin   10 mg Oral Daily   cefUROXime   500 mg Oral BID WC   Chlorhexidine  Gluconate Cloth  6 each Topical Daily   escitalopram   10 mg Oral Daily   levothyroxine   150 mcg Oral Q0600   liothyronine   25 mcg Oral Daily   metoprolol  succinate  25 mg Oral Daily   polyethylene glycol  17 g Oral Daily   Infusions:   heparin  1,300 Units/hr (07/06/24 0451)   PRN: acetaminophen , hydrOXYzine , morphine  injection, ondansetron   Assessment: 78 yo female presenting with bilateral pulmonary emboli, left greater than right. No history of PTA anticoagulation noted. Baseline CBC stable. Pharmacy consulted for heparin  dosing.   Goal of Therapy:  Heparin  level 0.3-0.7 units/ml Monitor platelets by anticoagulation protocol: Yes   Plan:  --08/21 0524 HL 0.29. subtherapeutic --Bolus 1200 units x 1 --Increase heparin  infusion to 1450 units/hr --Recheck HL in 8 hrs after rate change --CBC daily while on heparin   Rankin CANDIE Dills, PharmD, [facility] 07/06/2024 6:51 AM

## 2024-07-06 NOTE — Discharge Instructions (Signed)
 Please take your medications as prescribed.   Please follow up with your PCP in 1 week.

## 2024-07-06 NOTE — TOC CM/SW Note (Signed)
 Transition of Care [facility]) - Inpatient Brief Assessment   Patient Details  Name: [name] MRN: [id number] Date of Birth: 1946/03/31  Transition of Care [facility]) CM/SW Contact:    Lauraine JAYSON Carpen, LCSW Phone Number: 07/06/2024, 12:33 PM   Clinical Narrative: Patient has orders to discharge home today. Chart reviewed. No TOC needs identified. CSW acknowledges consult for medication assistance. Sent secure chat to the pharmacist to notify. CSW signing off.  Transition of Care Asessment: Insurance and Status: Insurance coverage has been reviewed Patient has primary care physician: Yes Home environment has been reviewed: Single family home Prior level of function:: Not documented Prior/Current Home Services: No current home services Social Drivers of Health Review: SDOH reviewed no interventions necessary Readmission risk has been reviewed: Yes Transition of care needs: no transition of care needs at this time

## 2024-07-09 NOTE — Discharge Summary (Signed)
 Physician Discharge Summary   Patient: [name] MRN: [id number] DOB: 09-20-1946  Admit date:     07/01/2024  Discharge date: 07/06/2024  Discharge Physician: Alban Pepper   PCP: Cleotilde Oneil FALCON, MD   Recommendations at discharge:    Repeat cbc at pcp to ensure hgb stable  Check folate, decreased one year ago   Discharge Diagnoses: Principal Problem:   CAP (community acquired pneumonia) Active Problems:   Acquired hypothyroidism   Benign essential hypertension   Plaque psoriasis   Chronic systolic CHF (congestive heart failure) (HCC)   Nonischemic cardiomyopathy (HCC)   Acute pulmonary embolism with acute cor pulmonale (HCC)   Pleural effusion   DVT of deep femoral vein, right (HCC)  Resolved Problems:   * No resolved hospital problems. *  Hospital Course: [name] with HTN, HLD, hypothyroidism who presented with chest pain.  Initially admitted for pneumonia, then found to have PE and extensive RLE DVT.   She was started on heparin  infusion and vascular surgery was consulted.  She underwent mechanical thrombectomy for PE 8/18.  She underwent mechanical thrombectomy of right lower extremity 8/20. She was transitioned to oral AC on the day of discharge.  Unclear if PT provoked or unprovoked, however vascular surgery recommending anticoagulation indefinitely.  She was noted to have a decrease in her hgb and this will need to be repeated outpatient.     Assessment and Plan: Possible pneumonia Initially admitted for pneumonia she completed course of antibiotics   Normocytic anemia  Hgb WNL on presentation. No obvious bleeding was noted.      Latest Ref Rng & Units 07/06/2024    5:24 AM 07/05/2024    6:03 AM 07/04/2024    3:14 AM  CBC  WBC 4.0 - 10.5 K/uL 9.3  9.3  12.9   Hemoglobin 12.0 - 15.0 g/dL 89.2  88.8  88.0   Hematocrit 36.0 - 46.0 % 32.1  33.3  35.2   Platelets 150 - 400 K/uL 257  222  211    Iron/TIBC/Ferritin/ %Sat    Component Value Date/Time    IRON 49 07/06/2024 0524   TIBC 175 (L) 07/06/2024 0524   FERRITIN 643 (H) 07/06/2024 0524   IRONPCTSAT 28 07/06/2024 0524   Iron was WNL. B12 WNL 1 month ago. Will need further work up in outpatient setting.   HFimpEF  HTN CAD Patient not on diuretic at home. She appeared euvolemic on day of discharge.   Her home antihypertensives were resolved on discharge.   Hypothyroidism  Home levothyroxine  and liothyronine  were continued  Anxiety  Home lexapro  was continued.       Consultants: [facility] Procedures performed: mechanical thrombectomy pulmonary and RLE   Disposition: Home Diet recommendation:  Regular diet DISCHARGE MEDICATION: Allergies as of 07/06/2024       Reactions   Ciprofloxacin Other (See Comments)   Caused cdiff.  C-Diff        Medication List     PAUSE taking these medications    meclizine  25 MG tablet Wait to take this until your doctor or other care provider tells you to start again. Commonly known as: ANTIVERT  Take 25 mg by mouth 2 (two) times daily as needed.       TAKE these medications    atorvastatin  10 MG tablet Commonly known as: LIPITOR Take 10 mg by mouth daily.   cyanocobalamin 1000 MCG/ML injection Commonly known as: VITAMIN B12 Inject 100 mcg into the skin every 30 (thirty) days.  Eliquis  5 MG Tabs tablet Generic drug: apixaban  Take 1 tablet (5 mg total) by mouth 2 (two) times daily.   escitalopram  10 MG tablet Commonly known as: LEXAPRO  Take 10 mg by mouth daily.   folic acid 1 MG tablet Commonly known as: FOLVITE Take 1 mg by mouth daily.   hydrOXYzine  10 MG tablet Commonly known as: ATARAX  Take 10 mg by mouth 3 (three) times daily.   levothyroxine  150 MCG tablet Commonly known as: SYNTHROID  Take by mouth.   liothyronine  25 MCG tablet Commonly known as: CYTOMEL  Take 25 mcg by mouth daily.   loratadine 10 MG tablet Commonly known as: CLARITIN Take 10 mg by mouth daily as needed.    losartan-hydrochlorothiazide 50-12.5 MG tablet Commonly known as: HYZAAR Take 1 tablet by mouth daily.   Melatonin 5 MG Caps Take 5 mg by mouth at bedtime.   metoprolol  succinate 25 MG 24 hr tablet Commonly known as: TOPROL -XL TAKE 1 TABLET(25 MG) BY MOUTH DAILY   traMADol  50 MG tablet Commonly known as: ULTRAM  Take 50 mg by mouth every 6 (six) hours as needed.   traZODone 50 MG tablet Commonly known as: DESYREL Take 50 mg by mouth at bedtime.       ASK your doctor about these medications    cefUROXime  500 MG tablet Commonly known as: CEFTIN  Take 1 tablet (500 mg total) by mouth 2 (two) times daily with a meal for 4 doses. Ask about: Should I take this medication?        Follow-up Information     Cleotilde Oneil FALCON, MD Follow up in 1 week(s).   Specialty: Internal Medicine Why: Hospital Follow up: Office did not answer, Please have patient call and follow up with making an appointment. Contact information: [address] [facility] [address] [facility] [place] [address] [phone]         [facility] at [facility] [place] [employer]. Schedule an appointment as soon as possible for a visit in 6 week(s).   Specialty: Vascular Surgery Why: Hospital Appointment: A referral will be sent in and the office will give you a call to have an appointment set up. Contact information: [address] [place]  [id number] [phone]               Discharge Exam: Filed Weights   07/01/24 1251 07/01/24 1920  Weight: 90.7 kg 82.7 kg   Physical Exam  Constitutional: In no distress.  Cardiovascular: Normal rate, regular rhythm. No lower extremity edema  Pulmonary: Non labored breathing on room air, no wheezing or rales.   Abdominal: Soft. Normal bowel sounds. Non distended and non tender Musculoskeletal: Normal range of motion.     Neurological: Alert and oriented to person, place, and time. Non focal   Skin: Skin is warm and dry.    Condition at discharge: good  The results of significant diagnostics from this hospitalization (including imaging, microbiology, ancillary and laboratory) are listed below for reference.   Imaging Studies: PERIPHERAL VASCULAR CATHETERIZATION Result Date: 07/05/2024 See surgical note for result.  PERIPHERAL VASCULAR CATHETERIZATION Result Date: 07/03/2024 See surgical note for result.  US  Venous Img Lower Bilateral (DVT) Result Date: 07/03/2024 CLINICAL DATA:  Pulmonary embolism. EXAM: BILATERAL LOWER EXTREMITY VENOUS DOPPLER ULTRASOUND TECHNIQUE: Gray-scale sonography with graded compression, as well as color Doppler and duplex ultrasound were performed to evaluate the lower extremity deep venous systems from the level of the common femoral  vein and including the common femoral, femoral, profunda femoral, popliteal and calf veins including the posterior tibial, peroneal and gastrocnemius veins when visible. The superficial great saphenous vein was also interrogated. Spectral Doppler was utilized to evaluate flow at rest and with distal augmentation maneuvers in the common femoral, femoral and popliteal veins. COMPARISON:  None Available. FINDINGS: RIGHT LOWER EXTREMITY Common Femoral Vein: No evidence of thrombus. Normal compressibility, respiratory phasicity and response to augmentation. Saphenofemoral Junction: No evidence of thrombus. Normal compressibility and flow on color Doppler imaging. Profunda Femoral Vein: No evidence of thrombus. Normal compressibility and flow on color Doppler imaging. Femoral Vein: Proximal femoral vein demonstrates nonocclusive thrombus. Mid to distal femoral vein contains occlusive thrombus. Popliteal Vein: Proximal popliteal vein contains a small amount of flow adjacent to thrombus. Mid to distal popliteal vein demonstrates occlusive thrombus. Calf Veins: Nonocclusive thrombus identified in posterior tibial and peroneal veins.  Superficial Great Saphenous Vein: No evidence of thrombus. Normal compressibility. Venous Reflux:  None. Other Findings: No evidence of superficial thrombophlebitis or abnormal fluid collection. LEFT LOWER EXTREMITY Common Femoral Vein: No evidence of thrombus. Normal compressibility, respiratory phasicity and response to augmentation. Saphenofemoral Junction: No evidence of thrombus. Normal compressibility and flow on color Doppler imaging. Profunda Femoral Vein: No evidence of thrombus. Normal compressibility and flow on color Doppler imaging. Femoral Vein: No evidence of thrombus. Normal compressibility, respiratory phasicity and response to augmentation. Popliteal Vein: No evidence of thrombus. Normal compressibility, respiratory phasicity and response to augmentation. Calf Veins: No evidence of thrombus. Normal compressibility and flow on color Doppler imaging. Superficial Great Saphenous Vein: No evidence of thrombus. Normal compressibility. Venous Reflux:  None. Other Findings: No evidence of superficial thrombophlebitis or abnormal fluid collection. IMPRESSION: 1. Positive for deep vein thrombosis in the right lower extremity. Right femoral vein thrombus is nonocclusive proximally and occlusive in the mid to distal femoral vein. Right popliteal vein thrombus is nonocclusive proximally and occlusive in the mid to distal popliteal vein. Nonocclusive thrombus also identified in the right posterior tibial and peroneal veins. 2. No evidence of deep vein thrombosis in the left lower extremity. Electronically Signed   By: Marcey Moan M.D.   On: 07/03/2024 15:03   ECHOCARDIOGRAM COMPLETE Result Date: 07/03/2024    ECHOCARDIOGRAM REPORT   Patient Name:   JILLIANNE GAMINO Resler Date of Exam: 07/03/2024 Medical Rec #:  [id number]      Height:       66.0 in Accession #:    [id number]     Weight:       182.3 lb Date of Birth:  10/04/1946       BSA:          1.923 m Patient Age:    78 years       BP:           102/89 mmHg  Patient Gender: F              HR:           64 bpm. Exam Location:  [facility] Procedure: 2D Echo, Cardiac Doppler, Color Doppler, Strain Analysis and 3D Echo            (Both Spectral and Color Flow Doppler were utilized during            procedure). Indications:     Chest pain R07.9  History:         Patient has prior history of Echocardiogram examinations, most  recent 12/13/2023. Risk Factors:Hypertension.  Sonographer:     Christopher Furnace Referring Phys:  [id number] DELAYNE LULLA SOLIAN Diagnosing Phys: Deatrice Cage MD  Sonographer Comments: Suboptimal parasternal window. Global longitudinal strain was attempted. IMPRESSIONS  1. Left ventricular ejection fraction, by estimation, is 55 to 60%. The left ventricle has normal function. The left ventricle has no regional wall motion abnormalities. There is mild left ventricular hypertrophy. Left ventricular diastolic parameters are consistent with Grade I diastolic dysfunction (impaired relaxation). The average left ventricular global longitudinal strain is -20.4 %. The global longitudinal strain is normal.  2. Right ventricular systolic function is normal. The right ventricular size is normal. Tricuspid regurgitation signal is inadequate for assessing PA pressure.  3. The mitral valve is normal in structure. No evidence of mitral valve regurgitation. No evidence of mitral stenosis.  4. The aortic valve is normal in structure. Aortic valve regurgitation is not visualized. Aortic valve sclerosis/calcification is present, without any evidence of aortic stenosis. Comparison(s): A prior study was performed on 12/11/2023. Changes from prior study are noted. The left ventricular function has improved. FINDINGS  Left Ventricle: Left ventricular ejection fraction, by estimation, is 55 to 60%. The left ventricle has normal function. The left ventricle has no regional wall motion abnormalities. The average left ventricular global longitudinal strain is -20.4 %. Strain was  performed and the global longitudinal strain is normal. 3D ejection fraction reviewed and evaluated as part of the interpretation. Alternate measurement of EF is felt to be most reflective of LV function. The left ventricular internal cavity size was normal in size. There is mild left ventricular hypertrophy. Left ventricular diastolic parameters are consistent with Grade I diastolic dysfunction (impaired relaxation). Right Ventricle: The right ventricular size is normal. No increase in right ventricular wall thickness. Right ventricular systolic function is normal. Tricuspid regurgitation signal is inadequate for assessing PA pressure. Left Atrium: Left atrial size was normal in size. Right Atrium: Right atrial size was normal in size. Pericardium: There is no evidence of pericardial effusion. Mitral Valve: The mitral valve is normal in structure. No evidence of mitral valve regurgitation. No evidence of mitral valve stenosis. MV peak gradient, 3.5 mmHg. The mean mitral valve gradient is 1.0 mmHg. Tricuspid Valve: The tricuspid valve is normal in structure. Tricuspid valve regurgitation is trivial. No evidence of tricuspid stenosis. Aortic Valve: The aortic valve is normal in structure. Aortic valve regurgitation is not visualized. Aortic valve sclerosis/calcification is present, without any evidence of aortic stenosis. Aortic valve mean gradient measures 4.0 mmHg. Aortic valve peak  gradient measures 8.8 mmHg. Aortic valve area, by VTI measures 3.37 cm. Pulmonic Valve: The pulmonic valve was normal in structure. Pulmonic valve regurgitation is trivial. No evidence of pulmonic stenosis. Aorta: The aortic root is normal in size and structure. Venous: The inferior vena cava was not well visualized. IAS/Shunts: No atrial level shunt detected by color flow Doppler.  LEFT VENTRICLE PLAX 2D LVIDd:         3.60 cm   Diastology LVIDs:         2.60 cm   LV e' medial:    6.31 cm/s LV PW:         1.50 cm   LV E/e' medial:   11.6 LV IVS:        1.40 cm   LV e' lateral:   6.74 cm/s LVOT diam:     2.10 cm   LV E/e' lateral: 10.9 LV SV:  87 LV SV Index:   45        2D Longitudinal Strain LVOT Area:     3.46 cm  2D Strain GLS Avg:     -20.4 %  RIGHT VENTRICLE RV Basal diam:  3.20 cm RV Mid diam:    2.90 cm RV S prime:     11.90 cm/s TAPSE (M-mode): 2.2 cm LEFT ATRIUM             Index        RIGHT ATRIUM           Index LA diam:        3.70 cm 1.92 cm/m   RA Area:     13.30 cm LA Vol (A2C):   45.3 ml 23.56 ml/m  RA Volume:   29.00 ml  15.08 ml/m LA Vol (A4C):   27.3 ml 14.20 ml/m LA Biplane Vol: 38.2 ml 19.87 ml/m  AORTIC VALVE AV Area (Vmax):    3.20 cm AV Area (Vmean):   3.58 cm AV Area (VTI):     3.37 cm AV Vmax:           148.50 cm/s AV Vmean:          93.200 cm/s AV VTI:            0.259 m AV Peak Grad:      8.8 mmHg AV Mean Grad:      4.0 mmHg LVOT Vmax:         137.00 cm/s LVOT Vmean:        96.400 cm/s LVOT VTI:          0.252 m LVOT/AV VTI ratio: 0.97  AORTA Ao Root diam: 2.90 cm MITRAL VALVE                TRICUSPID VALVE MV Area (PHT): 2.48 cm     TR Peak grad:   24.0 mmHg MV Area VTI:   3.05 cm     TR Vmax:        245.00 cm/s MV Peak grad:  3.5 mmHg MV Mean grad:  1.0 mmHg     SHUNTS MV Vmax:       0.94 m/s     Systemic VTI:  0.25 m MV Vmean:      54.2 cm/s    Systemic Diam: 2.10 cm MV Decel Time: 306 msec MV E velocity: 73.40 cm/s MV A velocity: 110.00 cm/s MV E/A ratio:  0.67 Deatrice Cage MD Electronically signed by Deatrice Cage MD Signature Date/Time: 07/03/2024/2:29:36 PM    Final    CT Angio Chest Pulmonary Embolism (PE) W or WO Contrast Addendum Date: 07/02/2024 ADDENDUM REPORT: 07/02/2024 19:40 ADDENDUM: These results were called by telephone at the time of interpretation on 07/02/2024 at 7:39 pm to provider Dr. Cleatus, Who verbally acknowledged these results. Electronically Signed   By: Franky Crease M.D.   On: 07/02/2024 19:40   Result Date: 07/02/2024 CLINICAL DATA:  pleuritic chest pain,  hypoxia, elevated dimer EXAM: CT ANGIOGRAPHY CHEST WITH CONTRAST TECHNIQUE: Multidetector CT imaging of the chest was performed using the standard protocol during bolus administration of intravenous contrast. Multiplanar CT image reconstructions and MIPs were obtained to evaluate the vascular anatomy. RADIATION DOSE REDUCTION: This exam was performed according to the departmental dose-optimization program which includes automated exposure control, adjustment of the mA and/or kV according to patient size and/or use of iterative reconstruction technique. CONTRAST:  75mL OMNIPAQUE  IOHEXOL  350 MG/ML SOLN COMPARISON:  07/01/2024 chest  x-ray and noncontrast CT. FINDINGS: Cardiovascular: Filling defects within upper and lower lobe pulmonary dural branches on the left as well as right lower lobe pulmonary arterial branches compatible with pulmonary emboli. Elevated RV/LV ratio at 1.09 compatible with right heart strain. Heart is borderline in size. Scattered aortic atherosclerosis. Aorta normal caliber. Mediastinum/Nodes: No mediastinal, hilar, or axillary adenopathy. Trachea and esophagus are unremarkable. Thyroid  unremarkable. Lungs/Pleura: Small left pleural effusion. Airspace disease in the left upper lobe and left lower lobe could reflect pneumonia or infarcts. No confluent opacity or effusion on the right. Upper Abdomen: No acute findings Musculoskeletal: Chest wall soft tissues are unremarkable. No acute bony abnormality. Review of the MIP images confirms the above findings. IMPRESSION: Bilateral pulmonary emboli, left greater than right. CT evidence of right heart strain (RV/LV Ratio = 1.09) consistent with at least submassive (intermediate risk) PE. The presence of right heart strain has been associated with an increased risk of morbidity and mortality. Please refer to the Code PE Focused order set in EPIC. Small left pleural effusion. Airspace disease in the left upper lobe and left lower lobe could reflect  pulmonary infarct or pneumonia. Electronically Signed: By: Franky Crease M.D. On: 07/02/2024 19:17   CT CHEST WO CONTRAST Result Date: 07/01/2024 CLINICAL DATA:  Left chest pain for the past 2 days with nausea and generalized weakness. Left basilar consolidation involving the lingula and left lower lobe and suspected small left pleural effusion on chest radiographs earlier today. EXAM: CT CHEST WITHOUT CONTRAST TECHNIQUE: Multidetector CT imaging of the chest was performed following the standard protocol without IV contrast. RADIATION DOSE REDUCTION: This exam was performed according to the departmental dose-optimization program which includes automated exposure control, adjustment of the mA and/or kV according to patient size and/or use of iterative reconstruction technique. COMPARISON:  Chest radiographs obtained earlier today. Cardiac/coronary CTA dated 11/27/2023. FINDINGS: Cardiovascular: Borderline enlarged heart. Minimal atheromatous calcifications, including the coronary arteries and aorta. Borderline dilated central pulmonary arteries with a main pulmonary artery diameter of 2.9 cm. No pericardial effusion. Mediastinum/Nodes: No enlarged mediastinal or axillary lymph nodes. Thyroid  gland, trachea, and esophagus demonstrate no significant findings. Lungs/Pleura: Interval patchy opacities in the anterior left upper lobe and in the left lower lobe. Small left pleural effusion. Clear right lung. Upper Abdomen: 1.8 cm lateral segment left lobe liver hemangioma or mildly complicated cyst. 6.0 cm simple appearing, partially included right renal cyst. This does not need imaging follow-up. Musculoskeletal: Elevated left hemidiaphragm. Thoracic spine degenerative changes and mild scoliosis. IMPRESSION: 1. Interval patchy opacities in the anterior left upper lobe and in the left lower lobe, compatible with pneumonia. 2. Small left pleural effusion. 3. Borderline dilated central pulmonary arteries, suggesting  borderline pulmonary arterial hypertension. 4. Minimal calcific coronary artery and aortic atherosclerosis. Aortic Atherosclerosis (ICD10-I70.0). Electronically Signed   By: Elspeth Bathe M.D.   On: 07/01/2024 16:06   DG Chest 2 View Result Date: 07/01/2024 CLINICAL DATA:  Left-sided chest pain for 2 days, nausea, generalized weakness EXAM: CHEST - 2 VIEW COMPARISON:  07/16/2013 FINDINGS: Frontal and lateral views of the chest demonstrate an unremarkable cardiac silhouette. There is consolidation at the left lung base, obscuring portions of the cardiac apex silhouette and left hemidiaphragm, consistent with lingular and left lower lobe consolidation. Small left effusion cannot be excluded. The right chest is clear. No pneumothorax. No acute bony abnormalities. IMPRESSION: 1. Left basilar consolidation involving the lingula and left lower lobe, which may reflect atelectasis or airspace disease. 2. Small left pleural  effusion is suspected. Electronically Signed   By: Ozell Daring M.D.   On: 07/01/2024 13:37    Microbiology: Results for orders placed or performed during the hospital encounter of 07/01/24  SARS Coronavirus 2 by RT PCR (hospital order, performed in [facility] hospital lab) *cepheid single result test* Anterior Nasal Swab     Status: None   Collection Time: 07/02/24 11:25 AM   Specimen: Anterior Nasal Swab  Result Value Ref Range Status   SARS Coronavirus 2 by RT PCR NEGATIVE NEGATIVE Final    Comment: (NOTE) SARS-CoV-2 target nucleic acids are NOT DETECTED.  The SARS-CoV-2 RNA is generally detectable in upper and lower respiratory specimens during the acute phase of infection. The lowest concentration of SARS-CoV-2 viral copies this assay can detect is 250 copies / mL. A negative result does not preclude SARS-CoV-2 infection and should not be used as the sole basis for treatment or other patient management decisions.  A negative result may occur with improper specimen collection /  handling, submission of specimen other than nasopharyngeal swab, presence of viral mutation(s) within the areas targeted by this assay, and inadequate number of viral copies (<250 copies / mL). A negative result must be combined with clinical observations, patient history, and epidemiological information.  Fact Sheet for Patients:   RoadLapTop.co.za  Fact Sheet for Healthcare Providers: http://kim-miller.com/  This test is not yet approved or  cleared by the [place]  FDA and has been authorized for detection and/or diagnosis of SARS-CoV-2 by FDA under an Emergency Use Authorization (EUA).  This EUA will remain in effect (meaning this test can be used) for the duration of the COVID-19 declaration under Section 564(b)(1) of the Act, 21 U.S.C. section 360bbb-3(b)(1), unless the authorization is terminated or revoked sooner.  Performed at [facility], [address] [place], [place] [address]   Respiratory (~20 pathogens) panel by PCR     Status: None   Collection Time: 07/02/24 11:51 AM  Result Value Ref Range Status   Adenovirus NOT DETECTED NOT DETECTED Final   Coronavirus 229E NOT DETECTED NOT DETECTED Final    Comment: (NOTE) The Coronavirus on the Respiratory Panel, DOES NOT test for the novel  Coronavirus (2019 nCoV)    Coronavirus HKU1 NOT DETECTED NOT DETECTED Final   Coronavirus NL63 NOT DETECTED NOT DETECTED Final   Coronavirus OC43 NOT DETECTED NOT DETECTED Final   Metapneumovirus NOT DETECTED NOT DETECTED Final   Rhinovirus / Enterovirus NOT DETECTED NOT DETECTED Final   Influenza A NOT DETECTED NOT DETECTED Final   Influenza B NOT DETECTED NOT DETECTED Final   Parainfluenza Virus 1 NOT DETECTED NOT DETECTED Final   Parainfluenza Virus 2 NOT DETECTED NOT DETECTED Final   Parainfluenza Virus 3 NOT DETECTED NOT DETECTED Final   Parainfluenza Virus 4 NOT DETECTED NOT DETECTED Final   Respiratory Syncytial  Virus NOT DETECTED NOT DETECTED Final   Bordetella pertussis NOT DETECTED NOT DETECTED Final   Bordetella Parapertussis NOT DETECTED NOT DETECTED Final   Chlamydophila pneumoniae NOT DETECTED NOT DETECTED Final   Mycoplasma pneumoniae NOT DETECTED NOT DETECTED Final    Comment: Performed at [facility], [address], [place], [place] [address]  MRSA Next Gen by PCR, Nasal     Status: None   Collection Time: 07/02/24  8:48 PM   Specimen: Nasal Mucosa; Nasal Swab  Result Value Ref Range Status   MRSA by PCR Next Gen NOT DETECTED NOT DETECTED Final    Comment: (NOTE)  The GeneXpert MRSA Assay (FDA approved for NASAL specimens only), is one component of a comprehensive MRSA colonization surveillance program. It is not intended to diagnose MRSA infection nor to guide or monitor treatment for MRSA infections. Test performance is not FDA approved in patients less than 54 years old. Performed at [facility], [address] [place], [place] [address]     Labs: CBC: Recent Labs  Lab 07/03/24 [phone] 07/04/24 0314 07/05/24 0603 07/06/24 0524  WBC 14.2* 12.9* 9.3 9.3  HGB 13.2 11.9* 11.1* 10.7*  HCT 38.7 35.2* 33.3* 32.1*  MCV 88.6 88.4 90.2 89.2  PLT 183 211 222 257   Basic Metabolic Panel: Recent Labs  Lab 07/03/24 0328 07/04/24 0314 07/06/24 0524  NA 140 137 140  K 4.0 3.7 3.9  CL 106 104 108  CO2 25 24 25   GLUCOSE 104* 108* 102*  BUN 24* 24* 21  CREATININE 1.08* 0.88 0.86  CALCIUM  8.7* 8.6* 8.5*   Liver Function Tests: No results for input(s): AST, ALT, ALKPHOS, BILITOT, PROT, ALBUMIN in the last 168 hours. CBG: Recent Labs  Lab 07/02/24 2040  GLUCAP 122*    Discharge time spent: greater than 30 minutes.  Signed: Alban Pepper, MD [facility] 07/09/2024

## 2024-07-11 ENCOUNTER — Other Ambulatory Visit: Payer: Self-pay | Admitting: Internal Medicine

## 2024-07-11 DIAGNOSIS — I2699 Other pulmonary embolism without acute cor pulmonale: Secondary | ICD-10-CM

## 2024-07-11 DIAGNOSIS — D6859 Other primary thrombophilia: Secondary | ICD-10-CM

## 2024-07-11 DIAGNOSIS — R1084 Generalized abdominal pain: Secondary | ICD-10-CM

## 2024-07-19 ENCOUNTER — Ambulatory Visit
Admission: RE | Admit: 2024-07-19 | Discharge: 2024-07-19 | Disposition: A | Discharge status: Home / Self Care | Source: Ambulatory Visit | Attending: Internal Medicine | Admitting: Internal Medicine

## 2024-07-19 DIAGNOSIS — D6859 Other primary thrombophilia: Secondary | ICD-10-CM | POA: Diagnosis present

## 2024-07-19 DIAGNOSIS — R1084 Generalized abdominal pain: Secondary | ICD-10-CM | POA: Diagnosis present

## 2024-07-19 DIAGNOSIS — I2699 Other pulmonary embolism without acute cor pulmonale: Secondary | ICD-10-CM | POA: Insufficient documentation

## 2024-07-19 MED ORDER — IOHEXOL 300 MG/ML  SOLN
100.0000 mL | Freq: Once | INTRAMUSCULAR | Status: AC | PRN
  Administered 2024-07-19: 100 mL via INTRAVENOUS

## 2024-08-14 ENCOUNTER — Other Ambulatory Visit (INDEPENDENT_AMBULATORY_CARE_PROVIDER_SITE_OTHER): Payer: Self-pay | Admitting: Vascular Surgery

## 2024-08-14 DIAGNOSIS — I82411 Acute embolism and thrombosis of right femoral vein: Secondary | ICD-10-CM

## 2024-08-15 ENCOUNTER — Ambulatory Visit (INDEPENDENT_AMBULATORY_CARE_PROVIDER_SITE_OTHER): Admitting: Vascular Surgery

## 2024-08-15 ENCOUNTER — Other Ambulatory Visit (INDEPENDENT_AMBULATORY_CARE_PROVIDER_SITE_OTHER)

## 2024-08-15 ENCOUNTER — Encounter (INDEPENDENT_AMBULATORY_CARE_PROVIDER_SITE_OTHER): Payer: Self-pay | Admitting: Vascular Surgery

## 2024-08-15 VITALS — BP 115/80 | Resp 18 | Ht 65.0 in | Wt 183.0 lb

## 2024-08-15 DIAGNOSIS — I2609 Other pulmonary embolism with acute cor pulmonale: Secondary | ICD-10-CM | POA: Diagnosis not present

## 2024-08-15 DIAGNOSIS — I82411 Acute embolism and thrombosis of right femoral vein: Secondary | ICD-10-CM | POA: Diagnosis not present

## 2024-08-15 DIAGNOSIS — I10 Essential (primary) hypertension: Secondary | ICD-10-CM

## 2024-08-15 NOTE — Progress Notes (Signed)
 MRN : [id number]  [name] is a 78 y.o. (Mar 23, 1946) female who presents with chief complaint of  Chief Complaint  Patient presents with   Follow-up    Follow up DVT 4 weeks  .  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  She underwent pulmonary thrombectomy first followed by right lower extremity venous thrombectomy the following day.  She has done very well.  She had no periprocedural complications for either procedure.  Her chest pain resolved and she is not having any shortness of breath at this point.  She is tolerating anticoagulation without any signs of bleeding.  Her leg is feeling well with no pain or swelling at this point.  Follow-up venous DVT study today demonstrated no evidence of residual DVT in the right lower extremity with some thrombus seen in the small saphenous vein only.  Current Outpatient Medications  Medication Sig Dispense Refill   apixaban  (ELIQUIS ) 5 MG TABS tablet Take 1 tablet (5 mg total) by mouth 2 (two) times daily. 60 tablet 5   atorvastatin  (LIPITOR) 10 MG tablet Take 10 mg by mouth daily.     cyanocobalamin (VITAMIN B12) 1000 MCG/ML injection Inject 100 mcg into the skin every 30 (thirty) days.     escitalopram  (LEXAPRO ) 10 MG tablet Take 10 mg by mouth daily.     folic acid (FOLVITE) 1 MG tablet Take 1 mg by mouth daily.     hydrOXYzine  (ATARAX ) 10 MG tablet Take 10 mg by mouth 3 (three) times daily.     Iron-Vitamin C (VITRON-C) 65-125 MG TABS Take 1 tablet by mouth daily.     levothyroxine  (SYNTHROID ) 150 MCG tablet Take by mouth.     liothyronine  (CYTOMEL ) 25 MCG tablet Take 25 mcg by mouth daily.     loratadine (CLARITIN) 10 MG tablet Take 10 mg by mouth daily as needed.     losartan-hydrochlorothiazide (HYZAAR) 50-12.5 MG tablet Take 1 tablet by mouth daily.     [Paused] meclizine  (ANTIVERT ) 25 MG tablet Take 25 mg by mouth 2 (two) times daily as needed.      Melatonin 5 MG CAPS Take 5 mg by mouth at bedtime.     metoprolol  succinate (TOPROL -XL) 25 MG 24 hr tablet TAKE 1 TABLET(25 MG) BY MOUTH DAILY 90 tablet 3   traMADol  (ULTRAM ) 50 MG tablet Take 50 mg by mouth every 6 (six) hours as needed.     traZODone (DESYREL) 50 MG tablet Take 50 mg by mouth at bedtime.     No current facility-administered medications for this visit.    Past Medical History:  Diagnosis Date   Arthritis    C. difficile diarrhea    Hypertension    Thyroid  disease     Past Surgical History:  Procedure Laterality Date   PERIPHERAL VASCULAR THROMBECTOMY Right 07/05/2024   Procedure: PERIPHERAL VASCULAR THROMBECTOMY;  Surgeon: Marea Selinda RAMAN, MD;  Location: [facility];  Service: Cardiovascular;  Laterality: Right;   PULMONARY THROMBECTOMY N/A 07/03/2024   Procedure: PULMONARY THROMBECTOMY;  Surgeon: Marea Selinda RAMAN, MD;  Location: [facility];  Service: Cardiovascular;  Laterality: N/A;   TONSILLECTOMY       Social History   Tobacco Use   Smoking status: Every Day    Current packs/day: 1.00    Average packs/day: 1 pack/day for 20.0 years (20.0 ttl pk-yrs)    Types: Cigarettes   Smokeless  tobacco: Never  Vaping Use   Vaping status: Never Used  Substance Use Topics   Alcohol use: No   Drug use: Never      Family History  Adopted: Yes  Family history unknown: Yes     Allergies  Allergen Reactions   Ciprofloxacin Other (See Comments)    Caused cdiff.   C-Diff     REVIEW OF SYSTEMS (Negative unless checked)  Constitutional: [] Weight loss  [] Fever  [] Chills Cardiac: [] Chest pain   [] Chest pressure   [] Palpitations   [] Shortness of breath when laying flat   [] Shortness of breath at rest   [] Shortness of breath with exertion. Vascular:  [] Pain in legs with walking   [] Pain in legs at rest   [] Pain in legs when laying flat   [] Claudication   [] Pain in feet when walking  [] Pain in feet at rest  [] Pain in feet when laying flat   [x] History  of DVT   [x] Phlebitis   [x] Swelling in legs   [] Varicose veins   [] Non-healing ulcers Pulmonary:   [] Uses home oxygen   [] Productive cough   [] Hemoptysis   [] Wheeze  [] COPD   [] Asthma Neurologic:  [] Dizziness  [] Blackouts   [] Seizures   [] History of stroke   [] History of TIA  [] Aphasia   [] Temporary blindness   [] Dysphagia   [] Weakness or numbness in arms   [] Weakness or numbness in legs Musculoskeletal:  [x] Arthritis   [] Joint swelling   [x] Joint pain   [] Low back pain Hematologic:  [] Easy bruising  [] Easy bleeding   [] Hypercoagulable state   [] Anemic   Gastrointestinal:  [] Blood in stool   [] Vomiting blood  [] Gastroesophageal reflux/heartburn   [] Abdominal pain Genitourinary:  [] Chronic kidney disease   [] Difficult urination  [] Frequent urination  [] Burning with urination   [] Hematuria Skin:  [] Rashes   [] Ulcers   [] Wounds Psychological:  [] History of anxiety   []  History of major depression.  Physical Examination  BP 115/80 (BP Location: Right Arm, Patient Position: Sitting, Cuff Size: Normal)   Resp 18   Ht 5' 5 (1.651 m)   Wt 183 lb (83 kg)   BMI 30.45 kg/m  Gen:  WD/WN, NAD Head: [place]/AT, No temporalis wasting. Ear/Nose/Throat: Hearing grossly intact, nares w/o erythema or drainage Eyes: Conjunctiva clear. Sclera non-icteric Neck: Supple.  Trachea midline Pulmonary:  Good air movement, no use of accessory muscles.  Cardiac: RRR, no JVD Vascular:  Vessel Right Left  Radial Palpable Palpable                          PT Palpable Palpable  DP Palpable Palpable   Gastrointestinal: soft, non-tender/non-distended. No guarding/reflex.  Musculoskeletal: M/S 5/5 throughout.  No deformity or atrophy. No significant LE edema. Neurologic: Sensation grossly intact in extremities.  Symmetrical.  Speech is fluent.  Psychiatric: Judgment intact, Mood & affect appropriate for pt's clinical situation. Dermatologic: No rashes or ulcers noted.  No cellulitis or open  wounds.      Labs Recent Results (from the past 2160 hours)  Basic metabolic panel     Status: Abnormal   Collection Time: 07/01/24 12:55 PM  Result Value Ref Range   Sodium 136 135 - 145 mmol/L   Potassium 4.0 3.5 - 5.1 mmol/L   Chloride 103 98 - 111 mmol/L   CO2 21 (L) 22 - 32 mmol/L   Glucose, Bld 119 (H) 70 - 99 mg/dL    Comment: Glucose reference range applies only to samples  taken after fasting for at least 8 hours.   BUN 23 8 - 23 mg/dL   Creatinine, Ser 9.05 0.44 - 1.00 mg/dL   Calcium  9.1 8.9 - 10.3 mg/dL   GFR, Estimated >39 >39 mL/min    Comment: (NOTE) Calculated using the CKD-EPI Creatinine Equation (2021)    Anion gap 12 5 - 15    Comment: Performed at [facility], [address] [place], [place] [address]  CBC     Status: Abnormal   Collection Time: 07/01/24 12:55 PM  Result Value Ref Range   WBC 16.6 (H) 4.0 - 10.5 K/uL   RBC 4.98 3.87 - 5.11 MIL/uL   Hemoglobin 14.9 12.0 - 15.0 g/dL   HCT 56.8 63.9 - 53.9 %   MCV 86.5 80.0 - 100.0 fL   MCH 29.9 26.0 - 34.0 pg   MCHC 34.6 30.0 - 36.0 g/dL   RDW 87.4 88.4 - 84.4 %   Platelets 197 150 - 400 K/uL   nRBC 0.0 0.0 - 0.2 %    Comment: Performed at [facility], [address], [place], [place] [address]  Troponin I (High Sensitivity)     Status: None   Collection Time: 07/01/24 12:55 PM  Result Value Ref Range   Troponin I (High Sensitivity) 12 <18 ng/L    Comment: (NOTE) Elevated high sensitivity troponin I (hsTnI) values and significant  changes across serial measurements may suggest ACS but many other  chronic and acute conditions are known to elevate hsTnI results.  Refer to the Links section for chest pain algorithms and additional  guidance. Performed at [facility], [address] [place], [place] [address]   HIV Antibody (routine testing w rflx)     Status: None   Collection Time: 07/02/24  4:48 AM  Result Value Ref Range   HIV Screen 4th Generation wRfx Non  Reactive Non Reactive    Comment: Performed at [facility], [address] [place], [place] [address]  CBC     Status: Abnormal   Collection Time: 07/02/24  4:48 AM  Result Value Ref Range   WBC 14.7 (H) 4.0 - 10.5 K/uL   RBC 4.53 3.87 - 5.11 MIL/uL   Hemoglobin 13.6 12.0 - 15.0 g/dL   HCT 59.4 63.9 - 53.9 %   MCV 89.4 80.0 - 100.0 fL   MCH 30.0 26.0 - 34.0 pg   MCHC 33.6 30.0 - 36.0 g/dL   RDW 87.4 88.4 - 84.4 %   Platelets 183 150 - 400 K/uL   nRBC 0.0 0.0 - 0.2 %    Comment: Performed at [facility], [address] [place], [place] [address]  Procalcitonin     Status: None   Collection Time: 07/02/24  4:48 AM  Result Value Ref Range   Procalcitonin 0.24 ng/mL    Comment:        Interpretation: PCT (Procalcitonin) <= 0.5 ng/mL: Systemic infection (sepsis) is not likely. Local bacterial infection is possible. (NOTE)       Sepsis PCT Algorithm           Lower Respiratory Tract                                      Infection PCT Algorithm    ----------------------------     ----------------------------         PCT < 0.25 ng/mL  PCT < 0.10 ng/mL          Strongly encourage             Strongly discourage   discontinuation of antibiotics    initiation of antibiotics    ----------------------------     -----------------------------       PCT 0.25 - 0.50 ng/mL            PCT 0.10 - 0.25 ng/mL               OR       >80% decrease in PCT            Discourage initiation of                                            antibiotics      Encourage discontinuation           of antibiotics    ----------------------------     -----------------------------         PCT >= 0.50 ng/mL              PCT 0.26 - 0.50 ng/mL               AND        <80% decrease in PCT             Encourage initiation of                                             antibiotics       Encourage continuation           of antibiotics    ----------------------------      -----------------------------        PCT >= 0.50 ng/mL                  PCT > 0.50 ng/mL               AND         increase in PCT                  Strongly encourage                                      initiation of antibiotics    Strongly encourage escalation           of antibiotics                                     -----------------------------                                           PCT <= 0.25 ng/mL                                                 OR                                        >  80% decrease in PCT                                      Discontinue / Do not initiate                                             antibiotics  Performed at [facility], [address] [place], [place] [address]   SARS Coronavirus 2 by RT PCR (hospital order, performed in [facility] hospital lab) *cepheid single result test* Anterior Nasal Swab     Status: None   Collection Time: 07/02/24 11:25 AM   Specimen: Anterior Nasal Swab  Result Value Ref Range   SARS Coronavirus 2 by RT PCR NEGATIVE NEGATIVE    Comment: (NOTE) SARS-CoV-2 target nucleic acids are NOT DETECTED.  The SARS-CoV-2 RNA is generally detectable in upper and lower respiratory specimens during the acute phase of infection. The lowest concentration of SARS-CoV-2 viral copies this assay can detect is 250 copies / mL. A negative result does not preclude SARS-CoV-2 infection and should not be used as the sole basis for treatment or other patient management decisions.  A negative result may occur with improper specimen collection / handling, submission of specimen other than nasopharyngeal swab, presence of viral mutation(s) within the areas targeted by this assay, and inadequate number of viral copies (<250 copies / mL). A negative result must be combined with clinical observations, patient history, and epidemiological information.  Fact Sheet for Patients:   RoadLapTop.co.za  Fact  Sheet for Healthcare Providers: http://kim-miller.com/  This test is not yet approved or  cleared by the [place]  FDA and has been authorized for detection and/or diagnosis of SARS-CoV-2 by FDA under an Emergency Use Authorization (EUA).  This EUA will remain in effect (meaning this test can be used) for the duration of the COVID-19 declaration under Section 564(b)(1) of the Act, 21 U.S.C. section 360bbb-3(b)(1), unless the authorization is terminated or revoked sooner.  Performed at [facility], [address] [place], [place] [address]   D-dimer, quantitative     Status: Abnormal   Collection Time: 07/02/24 11:45 AM  Result Value Ref Range   D-Dimer, Quant 3.53 (H) 0.00 - 0.50 ug/mL-FEU    Comment: (NOTE) At the manufacturer cut-off value of 0.5 g/mL FEU, this assay has a negative predictive value of 95-100%.This assay is intended for use in conjunction with a clinical pretest probability (PTP) assessment model to exclude pulmonary embolism (PE) and deep venous thrombosis (DVT) in outpatients suspected of PE or DVT. Results should be correlated with clinical presentation. Performed at [facility], [address] [place], [place] [address]   Troponin I (High Sensitivity)     Status: None   Collection Time: 07/02/24 11:45 AM  Result Value Ref Range   Troponin I (High Sensitivity) 10 <18 ng/L    Comment: (NOTE) Elevated high sensitivity troponin I (hsTnI) values and significant  changes across serial measurements may suggest ACS but many other  chronic and acute conditions are known to elevate hsTnI results.  Refer to the Links section for chest pain algorithms and additional  guidance. Performed at [facility], [address] [place], [place] [address]   Respiratory (~20 pathogens) panel by PCR     Status:  None   Collection Time: 07/02/24 11:51 AM  Result Value Ref Range   Adenovirus NOT DETECTED NOT DETECTED    Coronavirus 229E NOT DETECTED NOT DETECTED    Comment: (NOTE) The Coronavirus on the Respiratory Panel, DOES NOT test for the novel  Coronavirus (2019 nCoV)    Coronavirus HKU1 NOT DETECTED NOT DETECTED   Coronavirus NL63 NOT DETECTED NOT DETECTED   Coronavirus OC43 NOT DETECTED NOT DETECTED   Metapneumovirus NOT DETECTED NOT DETECTED   Rhinovirus / Enterovirus NOT DETECTED NOT DETECTED   Influenza A NOT DETECTED NOT DETECTED   Influenza B NOT DETECTED NOT DETECTED   Parainfluenza Virus 1 NOT DETECTED NOT DETECTED   Parainfluenza Virus 2 NOT DETECTED NOT DETECTED   Parainfluenza Virus 3 NOT DETECTED NOT DETECTED   Parainfluenza Virus 4 NOT DETECTED NOT DETECTED   Respiratory Syncytial Virus NOT DETECTED NOT DETECTED   Bordetella pertussis NOT DETECTED NOT DETECTED   Bordetella Parapertussis NOT DETECTED NOT DETECTED   Chlamydophila pneumoniae NOT DETECTED NOT DETECTED   Mycoplasma pneumoniae NOT DETECTED NOT DETECTED    Comment: Performed at [facility], [address] [place], [place] [address]  Legionella Pneumophila Serogp 1 Ur Ag     Status: None   Collection Time: 07/02/24  2:26 PM  Result Value Ref Range   L. pneumophila Serogp 1 Ur Ag Negative Negative    Comment: (NOTE) Presumptive negative for L. pneumophila serogroup 1 antigen in urine, suggesting no recent or current infection. Legionnaires' disease cannot be ruled out since other serogroups and species may also cause disease. Performed At: [facility] [address] [place], [place] [id number] Jennette Shorter MD Ey:[phone]    Source of Sample URINE, RANDOM     Comment: Performed at [facility], [address] [place], [place] [address]  Strep pneumoniae urinary antigen     Status: None   Collection Time: 07/02/24  2:26 PM  Result Value Ref Range   Strep Pneumo Urinary Antigen NEGATIVE NEGATIVE    Comment:        Infection due to S. pneumoniae cannot be absolutely ruled  out since the antigen present may be below the detection limit of the test. Performed at [facility], [address], [place], [place] [address]   Glucose, capillary     Status: Abnormal   Collection Time: 07/02/24  8:40 PM  Result Value Ref Range   Glucose-Capillary 122 (H) 70 - 99 mg/dL    Comment: Glucose reference range applies only to samples taken after fasting for at least 8 hours.  MRSA Next Gen by PCR, Nasal     Status: None   Collection Time: 07/02/24  8:48 PM   Specimen: Nasal Mucosa; Nasal Swab  Result Value Ref Range   MRSA by PCR Next Gen NOT DETECTED NOT DETECTED    Comment: (NOTE) The GeneXpert MRSA Assay (FDA approved for NASAL specimens only), is one component of a comprehensive MRSA colonization surveillance program. It is not intended to diagnose MRSA infection nor to guide or monitor treatment for MRSA infections. Test performance is not FDA approved in patients less than 16 years old. Performed at [facility], [address] [place], [place] [address]   CBC     Status: Abnormal   Collection Time: 07/03/24  3:28 AM  Result Value Ref Range   WBC 14.2 (H) 4.0 - 10.5 K/uL   RBC 4.37 3.87 - 5.11 MIL/uL   Hemoglobin 13.2 12.0 -  15.0 g/dL   HCT 61.2 63.9 - 53.9 %   MCV 88.6 80.0 - 100.0 fL   MCH 30.2 26.0 - 34.0 pg   MCHC 34.1 30.0 - 36.0 g/dL   RDW 87.5 88.4 - 84.4 %   Platelets 183 150 - 400 K/uL   nRBC 0.0 0.0 - 0.2 %    Comment: Performed at [facility], [address] [place], [place] [address]  Basic metabolic panel with GFR     Status: Abnormal   Collection Time: 07/03/24  3:28 AM  Result Value Ref Range   Sodium 140 135 - 145 mmol/L   Potassium 4.0 3.5 - 5.1 mmol/L   Chloride 106 98 - 111 mmol/L   CO2 25 22 - 32 mmol/L   Glucose, Bld 104 (H) 70 - 99 mg/dL    Comment: Glucose reference range applies only to samples taken after fasting for at least 8 hours.   BUN 24 (H) 8 - 23 mg/dL   Creatinine, Ser 8.91 (H) 0.44 -  1.00 mg/dL   Calcium  8.7 (L) 8.9 - 10.3 mg/dL   GFR, Estimated 53 (L) >60 mL/min    Comment: (NOTE) Calculated using the CKD-EPI Creatinine Equation (2021)    Anion gap 9 5 - 15    Comment: Performed at [facility], [address] [place], [place] [address]  Heparin  level (unfractionated)     Status: None   Collection Time: 07/03/24  3:28 AM  Result Value Ref Range   Heparin  Unfractionated 0.35 0.30 - 0.70 IU/mL    Comment: (NOTE) The clinical reportable range upper limit is being lowered to >1.10 to align with the FDA approved guidance for the current laboratory assay.  If heparin  results are below expected values, and patient dosage has  been confirmed, suggest follow up testing of antithrombin III levels. Performed at [facility], [address] [place], [place] [address]   Heparin  level (unfractionated)     Status: Abnormal   Collection Time: 07/03/24 12:09 PM  Result Value Ref Range   Heparin  Unfractionated 0.27 (L) 0.30 - 0.70 IU/mL    Comment: (NOTE) The clinical reportable range upper limit is being lowered to >1.10 to align with the FDA approved guidance for the current laboratory assay.  If heparin  results are below expected values, and patient dosage has  been confirmed, suggest follow up testing of antithrombin III levels. Performed at [facility], [address] [place], [place] [address]   ECHOCARDIOGRAM COMPLETE     Status: None   Collection Time: 07/03/24  1:48 PM  Result Value Ref Range   Weight 2,916.8 oz   Height 66 in   BP 102/89 mmHg   Ao pk vel 1.49 m/s   AV Area VTI 3.37 cm2   AR max vel 3.20 cm2   AV Mean grad 4.0 mmHg   AV Peak grad 8.8 mmHg   S' Lateral 2.60 cm   AV Area mean vel 3.58 cm2   Area-P 1/2 2.48 cm2   MV VTI 3.05 cm2   Est EF 55 - 60%   Heparin  level (unfractionated)     Status: None   Collection Time: 07/04/24 12:40 AM  Result Value Ref Range   Heparin  Unfractionated 0.33 0.30 - 0.70 IU/mL     Comment: (NOTE) The clinical reportable range upper limit is being lowered to >1.10 to align with the FDA approved guidance for the current laboratory assay.  If heparin  results are below expected values,  and patient dosage has  been confirmed, suggest follow up testing of antithrombin III levels. Performed at [facility], [address] [place], [place] [address]   CBC     Status: Abnormal   Collection Time: 07/04/24  3:14 AM  Result Value Ref Range   WBC 12.9 (H) 4.0 - 10.5 K/uL   RBC 3.98 3.87 - 5.11 MIL/uL   Hemoglobin 11.9 (L) 12.0 - 15.0 g/dL   HCT 64.7 (L) 63.9 - 53.9 %   MCV 88.4 80.0 - 100.0 fL   MCH 29.9 26.0 - 34.0 pg   MCHC 33.8 30.0 - 36.0 g/dL   RDW 87.4 88.4 - 84.4 %   Platelets 211 150 - 400 K/uL   nRBC 0.0 0.0 - 0.2 %    Comment: Performed at [facility], [address] [place], [place] [address]  Basic metabolic panel with GFR     Status: Abnormal   Collection Time: 07/04/24  3:14 AM  Result Value Ref Range   Sodium 137 135 - 145 mmol/L   Potassium 3.7 3.5 - 5.1 mmol/L   Chloride 104 98 - 111 mmol/L   CO2 24 22 - 32 mmol/L   Glucose, Bld 108 (H) 70 - 99 mg/dL    Comment: Glucose reference range applies only to samples taken after fasting for at least 8 hours.   BUN 24 (H) 8 - 23 mg/dL   Creatinine, Ser 9.11 0.44 - 1.00 mg/dL   Calcium  8.6 (L) 8.9 - 10.3 mg/dL   GFR, Estimated >39 >39 mL/min    Comment: (NOTE) Calculated using the CKD-EPI Creatinine Equation (2021)    Anion gap 9 5 - 15    Comment: Performed at [facility], [address] [place], [place] [address]  Heparin  level (unfractionated)     Status: None   Collection Time: 07/04/24  8:57 AM  Result Value Ref Range   Heparin  Unfractionated 0.35 0.30 - 0.70 IU/mL    Comment: (NOTE) The clinical reportable range upper limit is being lowered to >1.10 to align with the FDA approved guidance for the current laboratory assay.  If heparin  results are below  expected values, and patient dosage has  been confirmed, suggest follow up testing of antithrombin III levels. Performed at [facility], [address] [place], [place] [address]   CBC     Status: Abnormal   Collection Time: 07/05/24  6:03 AM  Result Value Ref Range   WBC 9.3 4.0 - 10.5 K/uL   RBC 3.69 (L) 3.87 - 5.11 MIL/uL   Hemoglobin 11.1 (L) 12.0 - 15.0 g/dL   HCT 66.6 (L) 63.9 - 53.9 %   MCV 90.2 80.0 - 100.0 fL   MCH 30.1 26.0 - 34.0 pg   MCHC 33.3 30.0 - 36.0 g/dL   RDW 87.3 88.4 - 84.4 %   Platelets 222 150 - 400 K/uL   nRBC 0.0 0.0 - 0.2 %    Comment: Performed at [facility], [address] [place], [place] [address]  Heparin  level (unfractionated)     Status: None   Collection Time: 07/05/24  6:03 AM  Result Value Ref Range   Heparin  Unfractionated 0.35 0.30 - 0.70 IU/mL    Comment: (NOTE) The clinical reportable range upper limit is being lowered to >1.10 to align with the FDA approved guidance for the current laboratory assay.  If heparin  results are below expected values, and patient dosage has  been confirmed, suggest follow up  testing of antithrombin III levels. Performed at [facility], [address] [place], [place] [address]   CBC     Status: Abnormal   Collection Time: 07/06/24  5:24 AM  Result Value Ref Range   WBC 9.3 4.0 - 10.5 K/uL   RBC 3.60 (L) 3.87 - 5.11 MIL/uL   Hemoglobin 10.7 (L) 12.0 - 15.0 g/dL   HCT 67.8 (L) 63.9 - 53.9 %   MCV 89.2 80.0 - 100.0 fL   MCH 29.7 26.0 - 34.0 pg   MCHC 33.3 30.0 - 36.0 g/dL   RDW 87.2 88.4 - 84.4 %   Platelets 257 150 - 400 K/uL   nRBC 0.0 0.0 - 0.2 %    Comment: Performed at [facility], [address] [place], [place] [address]  Heparin  level (unfractionated)     Status: Abnormal   Collection Time: 07/06/24  5:24 AM  Result Value Ref Range   Heparin  Unfractionated 0.29 (L) 0.30 - 0.70 IU/mL    Comment: (NOTE) The clinical reportable range upper limit  is being lowered to >1.10 to align with the FDA approved guidance for the current laboratory assay.  If heparin  results are below expected values, and patient dosage has  been confirmed, suggest follow up testing of antithrombin III levels. Performed at [facility], [address] [place], [place] [address]   Basic metabolic panel with GFR     Status: Abnormal   Collection Time: 07/06/24  5:24 AM  Result Value Ref Range   Sodium 140 135 - 145 mmol/L   Potassium 3.9 3.5 - 5.1 mmol/L   Chloride 108 98 - 111 mmol/L   CO2 25 22 - 32 mmol/L   Glucose, Bld 102 (H) 70 - 99 mg/dL    Comment: Glucose reference range applies only to samples taken after fasting for at least 8 hours.   BUN 21 8 - 23 mg/dL   Creatinine, Ser 9.13 0.44 - 1.00 mg/dL   Calcium  8.5 (L) 8.9 - 10.3 mg/dL   GFR, Estimated >39 >39 mL/min    Comment: (NOTE) Calculated using the CKD-EPI Creatinine Equation (2021)    Anion gap 7 5 - 15    Comment: Performed at [facility], [address] [place], [place] [address]  Iron and TIBC     Status: Abnormal   Collection Time: 07/06/24  5:24 AM  Result Value Ref Range   Iron 49 28 - 170 ug/dL   TIBC 824 (L) 749 - 549 ug/dL   Saturation Ratios 28 10.4 - 31.8 %   UIBC 126 ug/dL    Comment: Performed at [facility], [address] [place], [place] [address]  Ferritin     Status: Abnormal   Collection Time: 07/06/24  5:24 AM  Result Value Ref Range   Ferritin 643 (H) 11 - 307 ng/mL    Comment: Performed at [facility], [address] [place], [place] [address]    Radiology CT ABDOMEN PELVIS W CONTRAST Result Date: 07/24/2024 CLINICAL DATA:  Abdominal pain EXAM: CT ABDOMEN AND PELVIS WITH CONTRAST TECHNIQUE: Multidetector CT imaging of the abdomen and pelvis was performed using the standard protocol following bolus administration of intravenous contrast. RADIATION DOSE REDUCTION: This exam was performed according to the  departmental dose-optimization program which includes automated exposure control, adjustment of the mA and/or kV according to patient size and/or use of iterative reconstruction technique. CONTRAST:  OMNIPAQUE  IOHEXOL  300 MG/ML  SOLN COMPARISON:  07/16/2013  FINDINGS: Lower chest: We partially include a peripheral density Probable peripheral rounded atelectasis seen to anterolaterally in the left lower lobe based on comet tail appearance. Likewise there is suspected scarring atelectasis in the posterior basal segment left lower lobe mild cardiomegaly. Hepatobiliary: Chronic bilobed cyst in segment 3 of the liver image 15 series 2, also shown on 08/30 11/1012. No further imaging workup of this lesion is indicated. Gallbladder unremarkable.  No additional significant findings. Pancreas: Unremarkable Spleen: Unremarkable Adrenals/Urinary Tract: Both adrenal glands appear normal. Fluid density 5.8 cm simple cyst of the right mid kidney laterally on image 26 series 2, this measured 3.4 cm diameter in 2014. No further imaging workup of this lesion is indicated. A bladder cystocele extends 0.1 cm below the pubococcygeal line. Stomach/Bowel: Normal appendix. Low position of the anorectal junction due to pelvic floor laxity. Vascular/Lymphatic: Atherosclerosis is present, including aortoiliac atherosclerotic disease. Reproductive: Mildly low position of the uterus due to pelvic floor laxity. Other: No supplemental non-categorized findings. Musculoskeletal: Grade 1 degenerative anterolisthesis at L4-5 and L5-S1. IMPRESSION: 1. A specific cause for the patient's abdominal pain is not identified. 2. Pelvic floor laxity with low position of the anorectal junction and cystocele. 3. Grade 1 degenerative anterolisthesis at L4-5 and L5-S1. 4. Mild cardiomegaly. 5. Probable rounded atelectasis in the left lower lobe. 6.  Aortic Atherosclerosis (ICD10-I70.0). Electronically Signed   By: Ryan Salvage M.D.   On: 07/24/2024  13:16    Assessment/Plan  Benign essential hypertension blood pressure control important in reducing the progression of atherosclerotic disease. On appropriate oral medications.   Acute pulmonary embolism with acute cor pulmonale (HCC) Status post pulmonary thrombectomy about 6 weeks ago with great results.  No residual chest pain or shortness of breath.  Continue anticoagulation for 1 year.  Follow-up in 1 year to discuss whether or not to reduce the dose of anticoagulation or continue current.  DVT of deep femoral vein, right (HCC) Follow-up venous DVT study today demonstrated no evidence of residual DVT in the right lower extremity with some thrombus seen in the small saphenous vein only.  Doing well.  No significant postphlebitic symptoms at this point.  Continue anticoagulation.  If she develops any swelling would recommend compression socks and elevation.    Selinda Gu, MD  08/15/2024 3:59 PM    This note was created with Dragon medical transcription system.  Any errors from dictation are purely unintentional

## 2024-08-15 NOTE — Assessment & Plan Note (Signed)
 blood pressure control important in reducing the progression of atherosclerotic disease. On appropriate oral medications.

## 2024-08-15 NOTE — Assessment & Plan Note (Signed)
 Follow-up venous DVT study today demonstrated no evidence of residual DVT in the right lower extremity with some thrombus seen in the small saphenous vein only.  Doing well.  No significant postphlebitic symptoms at this point.  Continue anticoagulation.  If she develops any swelling would recommend compression socks and elevation.

## 2024-08-15 NOTE — Assessment & Plan Note (Signed)
 Status post pulmonary thrombectomy about 6 weeks ago with great results.  No residual chest pain or shortness of breath.  Continue anticoagulation for 1 year.  Follow-up in 1 year to discuss whether or not to reduce the dose of anticoagulation or continue current.

## 2024-08-22 ENCOUNTER — Other Ambulatory Visit: Payer: Self-pay | Admitting: Neurology

## 2024-08-22 ENCOUNTER — Ambulatory Visit
Admission: RE | Admit: 2024-08-22 | Discharge: 2024-08-22 | Disposition: A | Discharge status: Home / Self Care | Source: Ambulatory Visit | Attending: Neurology | Admitting: Neurology

## 2024-08-22 DIAGNOSIS — Z9189 Other specified personal risk factors, not elsewhere classified: Secondary | ICD-10-CM | POA: Diagnosis present

## 2024-08-28 ENCOUNTER — Inpatient Hospital Stay: Admitting: Anesthesiology

## 2024-08-28 ENCOUNTER — Inpatient Hospital Stay
Admission: EM | Admit: 2024-08-28 | Discharge: 2024-08-31 | DRG: 481 | Disposition: A | Discharge status: Skilled Nursing Facility | Attending: Osteopathic Medicine | Admitting: Osteopathic Medicine

## 2024-08-28 ENCOUNTER — Other Ambulatory Visit: Payer: Self-pay

## 2024-08-28 ENCOUNTER — Encounter
Admission: EM | Disposition: A | Discharge status: Skilled Nursing Facility | Payer: Self-pay | Source: Home / Self Care | Attending: Osteopathic Medicine

## 2024-08-28 ENCOUNTER — Emergency Department

## 2024-08-28 ENCOUNTER — Inpatient Hospital Stay

## 2024-08-28 DIAGNOSIS — Z79899 Other long term (current) drug therapy: Secondary | ICD-10-CM

## 2024-08-28 DIAGNOSIS — N179 Acute kidney failure, unspecified: Secondary | ICD-10-CM | POA: Diagnosis present

## 2024-08-28 DIAGNOSIS — W010XXA Fall on same level from slipping, tripping and stumbling without subsequent striking against object, initial encounter: Secondary | ICD-10-CM | POA: Diagnosis present

## 2024-08-28 DIAGNOSIS — S72143A Displaced intertrochanteric fracture of unspecified femur, initial encounter for closed fracture: Secondary | ICD-10-CM | POA: Diagnosis not present

## 2024-08-28 DIAGNOSIS — I428 Other cardiomyopathies: Secondary | ICD-10-CM | POA: Diagnosis present

## 2024-08-28 DIAGNOSIS — Z881 Allergy status to other antibiotic agents status: Secondary | ICD-10-CM

## 2024-08-28 DIAGNOSIS — I110 Hypertensive heart disease with heart failure: Secondary | ICD-10-CM | POA: Diagnosis present

## 2024-08-28 DIAGNOSIS — S72002A Fracture of unspecified part of neck of left femur, initial encounter for closed fracture: Secondary | ICD-10-CM | POA: Diagnosis present

## 2024-08-28 DIAGNOSIS — E860 Dehydration: Secondary | ICD-10-CM | POA: Diagnosis present

## 2024-08-28 DIAGNOSIS — I5022 Chronic systolic (congestive) heart failure: Secondary | ICD-10-CM | POA: Diagnosis present

## 2024-08-28 DIAGNOSIS — Z01810 Encounter for preprocedural cardiovascular examination: Secondary | ICD-10-CM | POA: Diagnosis not present

## 2024-08-28 DIAGNOSIS — Z86718 Personal history of other venous thrombosis and embolism: Secondary | ICD-10-CM | POA: Diagnosis not present

## 2024-08-28 DIAGNOSIS — S72142A Displaced intertrochanteric fracture of left femur, initial encounter for closed fracture: Principal | ICD-10-CM | POA: Diagnosis present

## 2024-08-28 DIAGNOSIS — E876 Hypokalemia: Secondary | ICD-10-CM | POA: Diagnosis present

## 2024-08-28 DIAGNOSIS — Z7901 Long term (current) use of anticoagulants: Secondary | ICD-10-CM | POA: Diagnosis not present

## 2024-08-28 DIAGNOSIS — Z79890 Hormone replacement therapy (postmenopausal): Secondary | ICD-10-CM | POA: Diagnosis not present

## 2024-08-28 DIAGNOSIS — E039 Hypothyroidism, unspecified: Secondary | ICD-10-CM | POA: Diagnosis present

## 2024-08-28 DIAGNOSIS — Z86711 Personal history of pulmonary embolism: Secondary | ICD-10-CM | POA: Diagnosis not present

## 2024-08-28 DIAGNOSIS — Z5982 Transportation insecurity: Secondary | ICD-10-CM

## 2024-08-28 DIAGNOSIS — F17210 Nicotine dependence, cigarettes, uncomplicated: Secondary | ICD-10-CM | POA: Diagnosis present

## 2024-08-28 DIAGNOSIS — Z122 Encounter for screening for malignant neoplasm of respiratory organs: Secondary | ICD-10-CM

## 2024-08-28 DIAGNOSIS — I10 Essential (primary) hypertension: Secondary | ICD-10-CM | POA: Diagnosis present

## 2024-08-28 HISTORY — PX: INTRAMEDULLARY (IM) NAIL INTERTROCHANTERIC: SHX5875

## 2024-08-28 LAB — BASIC METABOLIC PANEL WITH GFR
Anion gap: 18 — ABNORMAL HIGH (ref 5–15)
BUN: 34 mg/dL — ABNORMAL HIGH (ref 8–23)
CO2: 23 mmol/L (ref 22–32)
Calcium: 9.1 mg/dL (ref 8.9–10.3)
Chloride: 100 mmol/L (ref 98–111)
Creatinine, Ser: 1.39 mg/dL — ABNORMAL HIGH (ref 0.44–1.00)
GFR, Estimated: 39 mL/min — ABNORMAL LOW (ref >60)
Glucose, Bld: 131 mg/dL — ABNORMAL HIGH (ref 70–99)
Potassium: 3.1 mmol/L — ABNORMAL LOW (ref 3.5–5.1)
Sodium: 141 mmol/L (ref 135–145)

## 2024-08-28 LAB — CBC
HCT: 45.7 % (ref 36.0–46.0)
Hemoglobin: 14.7 g/dL (ref 12.0–15.0)
MCH: 28.7 pg (ref 26.0–34.0)
MCHC: 32.2 g/dL (ref 30.0–36.0)
MCV: 89.3 fL (ref 80.0–100.0)
Platelets: 264 K/uL (ref 150–400)
RBC: 5.12 MIL/uL — ABNORMAL HIGH (ref 3.87–5.11)
RDW: 13.1 % (ref 11.5–15.5)
WBC: 13.6 K/uL — ABNORMAL HIGH (ref 4.0–10.5)
nRBC: 0 % (ref 0.0–0.2)

## 2024-08-28 LAB — TYPE AND SCREEN
ABO/RH(D): A NEG
Antibody Screen: NEGATIVE

## 2024-08-28 LAB — MAGNESIUM: Magnesium: 2.4 mg/dL (ref 1.7–2.4)

## 2024-08-28 LAB — PROTIME-INR
INR: 1 (ref 0.8–1.2)
Prothrombin Time: 13.4 s (ref 11.4–15.2)

## 2024-08-28 SURGERY — FIXATION, FRACTURE, INTERTROCHANTERIC, WITH INTRAMEDULLARY ROD
Anesthesia: General | Site: Hip | Laterality: Left

## 2024-08-28 MED ORDER — LIDOCAINE HCL (CARDIAC) PF 100 MG/5ML IV SOSY
PREFILLED_SYRINGE | INTRAVENOUS | Status: DC | PRN
  Administered 2024-08-28: 100 mg via INTRAVENOUS

## 2024-08-28 MED ORDER — ONDANSETRON HCL 4 MG/2ML IJ SOLN
INTRAMUSCULAR | Status: DC | PRN
  Administered 2024-08-28: 4 mg via INTRAVENOUS

## 2024-08-28 MED ORDER — ONDANSETRON HCL 4 MG/2ML IJ SOLN
4.0000 mg | Freq: Once | INTRAMUSCULAR | Status: AC
  Administered 2024-08-28: 4 mg via INTRAVENOUS
  Filled 2024-08-28: qty 2

## 2024-08-28 MED ORDER — CEFAZOLIN SODIUM-DEXTROSE 2-4 GM/100ML-% IV SOLN
INTRAVENOUS | Status: AC
  Filled 2024-08-28: qty 100

## 2024-08-28 MED ORDER — HYDROCODONE-ACETAMINOPHEN 5-325 MG PO TABS: 1.0000 | ORAL_TABLET | Freq: Four times a day (QID) | ORAL | Status: DC | PRN

## 2024-08-28 MED ORDER — PROPOFOL 10 MG/ML IV BOLUS
INTRAVENOUS | Status: DC | PRN
  Administered 2024-08-28: 100 mg via INTRAVENOUS

## 2024-08-28 MED ORDER — ACETAMINOPHEN 10 MG/ML IV SOLN: 1000.0000 mg | Freq: Once | INTRAVENOUS | Status: DC | PRN

## 2024-08-28 MED ORDER — MELATONIN 5 MG PO TABS
2.5000 mg | ORAL_TABLET | Freq: Every day | ORAL | Status: DC
  Administered 2024-08-28 – 2024-08-29 (×2): 2.5 mg via ORAL
  Filled 2024-08-28 (×3): qty 1

## 2024-08-28 MED ORDER — HYDROMORPHONE HCL 1 MG/ML IJ SOLN
INTRAMUSCULAR | Status: AC
  Filled 2024-08-28: qty 1

## 2024-08-28 MED ORDER — HYDROCODONE-ACETAMINOPHEN 7.5-325 MG PO TABS
1.0000 | ORAL_TABLET | ORAL | Status: DC | PRN
  Administered 2024-08-28 – 2024-08-29 (×2): 2 via ORAL
  Administered 2024-08-31: 1 via ORAL
  Filled 2024-08-28: qty 2
  Filled 2024-08-28: qty 1
  Filled 2024-08-28: qty 2

## 2024-08-28 MED ORDER — OXYCODONE HCL 5 MG PO TABS
ORAL_TABLET | ORAL | Status: AC
  Filled 2024-08-28: qty 1

## 2024-08-28 MED ORDER — HYDROMORPHONE HCL 1 MG/ML IJ SOLN
INTRAMUSCULAR | Status: DC | PRN
  Administered 2024-08-28: 1 mg via INTRAVENOUS

## 2024-08-28 MED ORDER — FENTANYL CITRATE (PF) 100 MCG/2ML IJ SOLN
25.0000 ug | INTRAMUSCULAR | Status: DC | PRN
  Administered 2024-08-28 (×2): 25 ug via INTRAVENOUS
  Administered 2024-08-28: 50 ug via INTRAVENOUS
  Administered 2024-08-28: 25 ug via INTRAVENOUS

## 2024-08-28 MED ORDER — DEXAMETHASONE SOD PHOSPHATE PF 10 MG/ML IJ SOLN
INTRAMUSCULAR | Status: DC | PRN
  Administered 2024-08-28: 10 mg via INTRAVENOUS

## 2024-08-28 MED ORDER — LACTATED RINGERS IV BOLUS
500.0000 mL | Freq: Once | INTRAVENOUS | Status: AC
  Administered 2024-08-28: 500 mL via INTRAVENOUS

## 2024-08-28 MED ORDER — MENTHOL 3 MG MT LOZG: 1.0000 | LOZENGE | OROMUCOSAL | Status: DC | PRN

## 2024-08-28 MED ORDER — POTASSIUM CHLORIDE CRYS ER 20 MEQ PO TBCR
60.0000 meq | EXTENDED_RELEASE_TABLET | Freq: Once | ORAL | Status: AC
  Administered 2024-08-28: 60 meq via ORAL
  Filled 2024-08-28: qty 3

## 2024-08-28 MED ORDER — DOCUSATE SODIUM 100 MG PO CAPS
100.0000 mg | ORAL_CAPSULE | Freq: Two times a day (BID) | ORAL | Status: DC
  Filled 2024-08-28: qty 1

## 2024-08-28 MED ORDER — 0.9 % SODIUM CHLORIDE (POUR BTL) OPTIME
TOPICAL | Status: DC | PRN
  Administered 2024-08-28: 1000 mL

## 2024-08-28 MED ORDER — FENTANYL CITRATE (PF) 100 MCG/2ML IJ SOLN
INTRAMUSCULAR | Status: AC
  Filled 2024-08-28: qty 2

## 2024-08-28 MED ORDER — ONDANSETRON HCL 4 MG/2ML IJ SOLN
4.0000 mg | Freq: Four times a day (QID) | INTRAMUSCULAR | Status: DC | PRN
Start: 2024-08-28 — End: 2024-08-31

## 2024-08-28 MED ORDER — ACETAMINOPHEN 500 MG PO TABS
500.0000 mg | ORAL_TABLET | Freq: Four times a day (QID) | ORAL | Status: AC
  Administered 2024-08-29 (×2): 500 mg via ORAL
  Filled 2024-08-28 (×3): qty 1

## 2024-08-28 MED ORDER — METOCLOPRAMIDE HCL 5 MG PO TABS: 5.0000 mg | ORAL_TABLET | Freq: Three times a day (TID) | ORAL | Status: DC | PRN

## 2024-08-28 MED ORDER — BUPIVACAINE-EPINEPHRINE (PF) 0.25% -1:200000 IJ SOLN
INTRAMUSCULAR | Status: DC | PRN
  Administered 2024-08-28: 20 mL

## 2024-08-28 MED ORDER — APIXABAN 5 MG PO TABS
5.0000 mg | ORAL_TABLET | Freq: Two times a day (BID) | ORAL | Status: DC
  Administered 2024-08-29 – 2024-08-31 (×5): 5 mg via ORAL
  Filled 2024-08-28 (×5): qty 1

## 2024-08-28 MED ORDER — DROPERIDOL 2.5 MG/ML IJ SOLN: 0.6250 mg | Freq: Once | INTRAMUSCULAR | Status: DC | PRN

## 2024-08-28 MED ORDER — MORPHINE SULFATE (PF) 4 MG/ML IV SOLN
4.0000 mg | Freq: Once | INTRAVENOUS | Status: AC
  Administered 2024-08-28: 4 mg via INTRAVENOUS
  Filled 2024-08-28: qty 1

## 2024-08-28 MED ORDER — BUPIVACAINE-EPINEPHRINE (PF) 0.25% -1:200000 IJ SOLN
INTRAMUSCULAR | Status: AC
  Filled 2024-08-28: qty 30

## 2024-08-28 MED ORDER — CHLORHEXIDINE GLUCONATE 0.12 % MT SOLN
OROMUCOSAL | Status: AC
  Filled 2024-08-28: qty 15

## 2024-08-28 MED ORDER — MORPHINE SULFATE (PF) 2 MG/ML IV SOLN: 0.5000 mg | INTRAVENOUS | Status: DC | PRN

## 2024-08-28 MED ORDER — CEFAZOLIN SODIUM-DEXTROSE 2-4 GM/100ML-% IV SOLN
2.0000 g | Freq: Four times a day (QID) | INTRAVENOUS | Status: AC
  Administered 2024-08-28 – 2024-08-29 (×2): 2 g via INTRAVENOUS
  Filled 2024-08-28 (×2): qty 100

## 2024-08-28 MED ORDER — FENTANYL CITRATE (PF) 50 MCG/ML IJ SOSY
100.0000 ug | PREFILLED_SYRINGE | Freq: Once | INTRAMUSCULAR | Status: AC
  Administered 2024-08-28: 100 ug via INTRAVENOUS
  Filled 2024-08-28: qty 2

## 2024-08-28 MED ORDER — HEPARIN SODIUM (PORCINE) 5000 UNIT/ML IJ SOLN: 5000.0000 [IU] | Freq: Three times a day (TID) | INTRAMUSCULAR | Status: DC

## 2024-08-28 MED ORDER — SUGAMMADEX SODIUM 200 MG/2ML IV SOLN
INTRAVENOUS | Status: DC | PRN
  Administered 2024-08-28: 200 mg via INTRAVENOUS

## 2024-08-28 MED ORDER — HYDROCODONE-ACETAMINOPHEN 5-325 MG PO TABS
1.0000 | ORAL_TABLET | ORAL | Status: DC | PRN
  Administered 2024-08-29: 1 via ORAL
  Administered 2024-08-29 – 2024-08-30 (×3): 2 via ORAL
  Filled 2024-08-28 (×4): qty 2

## 2024-08-28 MED ORDER — CEFAZOLIN SODIUM-DEXTROSE 2-4 GM/100ML-% IV SOLN
2.0000 g | Freq: Once | INTRAVENOUS | Status: AC
  Administered 2024-08-28: 2 g via INTRAVENOUS

## 2024-08-28 MED ORDER — ROCURONIUM BROMIDE 100 MG/10ML IV SOLN
INTRAVENOUS | Status: DC | PRN
  Administered 2024-08-28: 20 mg via INTRAVENOUS

## 2024-08-28 MED ORDER — FENTANYL CITRATE (PF) 100 MCG/2ML IJ SOLN
INTRAMUSCULAR | Status: DC | PRN
  Administered 2024-08-28 (×2): 50 ug via INTRAVENOUS

## 2024-08-28 MED ORDER — SUCCINYLCHOLINE CHLORIDE 200 MG/10ML IV SOSY
PREFILLED_SYRINGE | INTRAVENOUS | Status: DC | PRN
Start: 2024-08-28 — End: 2024-08-28
  Administered 2024-08-28: 100 mg via INTRAVENOUS

## 2024-08-28 MED ORDER — LACTATED RINGERS IV SOLN
INTRAVENOUS | Status: DC | PRN
Start: 2024-08-28 — End: 2024-08-28

## 2024-08-28 MED ORDER — OXYCODONE HCL 5 MG/5ML PO SOLN: 5.0000 mg | Freq: Once | ORAL | Status: AC | PRN

## 2024-08-28 MED ORDER — ACETAMINOPHEN 325 MG PO TABS: 325.0000 mg | ORAL_TABLET | Freq: Four times a day (QID) | ORAL | Status: DC | PRN

## 2024-08-28 MED ORDER — PHENYLEPHRINE 80 MCG/ML (10ML) SYRINGE FOR IV PUSH (FOR BLOOD PRESSURE SUPPORT)
PREFILLED_SYRINGE | INTRAVENOUS | Status: DC | PRN
Start: 2024-08-28 — End: 2024-08-28
  Administered 2024-08-28: 80 ug via INTRAVENOUS
  Administered 2024-08-28: 160 ug via INTRAVENOUS

## 2024-08-28 MED ORDER — SENNOSIDES-DOCUSATE SODIUM 8.6-50 MG PO TABS
1.0000 | ORAL_TABLET | Freq: Every evening | ORAL | Status: DC | PRN
Start: 2024-08-28 — End: 2024-08-29

## 2024-08-28 MED ORDER — ONDANSETRON HCL 4 MG PO TABS: 4.0000 mg | ORAL_TABLET | Freq: Four times a day (QID) | ORAL | Status: DC | PRN

## 2024-08-28 MED ORDER — PHENOL 1.4 % MT LIQD: 1.0000 | OROMUCOSAL | Status: DC | PRN

## 2024-08-28 MED ORDER — METOCLOPRAMIDE HCL 5 MG/ML IJ SOLN: 5.0000 mg | Freq: Three times a day (TID) | INTRAMUSCULAR | Status: DC | PRN

## 2024-08-28 MED ORDER — OXYCODONE HCL 5 MG PO TABS
5.0000 mg | ORAL_TABLET | Freq: Once | ORAL | Status: AC | PRN
  Administered 2024-08-28: 5 mg via ORAL

## 2024-08-28 MED ORDER — PROPOFOL 10 MG/ML IV BOLUS
INTRAVENOUS | Status: AC
Start: 2024-08-28 — End: 2024-08-28
  Filled 2024-08-28: qty 20

## 2024-08-28 SURGICAL SUPPLY — 43 items
BIT DRILL CALIBRATED 4.2 (BIT) IMPLANT
BIT DRILL CANN 16 HIP (BIT) IMPLANT
BIT DRILL CANN STP 6/9 HIP (BIT) IMPLANT
BIT DRILL TAPERED 10 (BIT) IMPLANT
BLADE HELICAL TFNA 90 (Anchor) IMPLANT
BNDG COHESIVE 6X5 TAN ST LF (GAUZE/BANDAGES/DRESSINGS) ×2 IMPLANT
CHLORAPREP W/TINT 26 (MISCELLANEOUS) ×1 IMPLANT
DERMABOND ADVANCED .7 DNX12 (GAUZE/BANDAGES/DRESSINGS) ×1 IMPLANT
DRAPE C-ARM XRAY 36X54 (DRAPES) ×1 IMPLANT
DRAPE C-ARMOR (DRAPES) IMPLANT
DRAPE SHEET LG 3/4 BI-LAMINATE (DRAPES) ×1 IMPLANT
DRSG OPSITE POSTOP 3X4 (GAUZE/BANDAGES/DRESSINGS) IMPLANT
DRSG OPSITE POSTOP 4X6 (GAUZE/BANDAGES/DRESSINGS) IMPLANT
ELECT CAUTERY BLADE 6.4 (BLADE) IMPLANT
ELECTRODE REM PT RTRN 9FT ADLT (ELECTROSURGICAL) IMPLANT
GLOVE PI ORTHO PRO STRL 7.5 (GLOVE) ×2 IMPLANT
GLOVE SURG SYN 7.5 PF PI (GLOVE) ×1 IMPLANT
GOWN SRG XL LONG LVL 3 NONREIN (GOWNS) ×1 IMPLANT
GOWN SRG XL LVL 3 NONREINFORCE (GOWNS) ×1 IMPLANT
GOWN STRL REUS W/ TWL LRG LVL3 (GOWN DISPOSABLE) ×1 IMPLANT
GUIDEWIRE 3.2X400 (WIRE) IMPLANT
HANDLE YANKAUER SUCT OPEN TIP (MISCELLANEOUS) ×1 IMPLANT
KIT PATIENT CARE HANA TABLE (KITS) ×1 IMPLANT
KIT TURNOVER CYSTO (KITS) ×1 IMPLANT
MANIFOLD NEPTUNE II (INSTRUMENTS) ×1 IMPLANT
MAT ABSORB FLUID 56X50 GRAY (MISCELLANEOUS) ×1 IMPLANT
NAIL CANN TFNA 9MM/130 DEG TI (Nail) IMPLANT
NDL HYPO 21X1.5 SAFETY (NEEDLE) ×1 IMPLANT
NEEDLE HYPO 21X1.5 SAFETY (NEEDLE) ×1 IMPLANT
NS IRRIG 500ML POUR BTL (IV SOLUTION) ×1 IMPLANT
PACK HIP COMPR (MISCELLANEOUS) ×1 IMPLANT
PAD ARMBOARD POSITIONER FOAM (MISCELLANEOUS) ×1 IMPLANT
PENCIL SMOKE EVACUATOR (MISCELLANEOUS) ×1 IMPLANT
SCREW LOCK STAR 5X36 (Screw) IMPLANT
SLEEVE SCD COMPRESS KNEE MED (STOCKING) ×1 IMPLANT
SOLN STERILE WATER 1000 ML (IV SOLUTION) ×1 IMPLANT
SOLN STERILE WATER BTL 1000 ML (IV SOLUTION) ×1 IMPLANT
SUT VIC AB 1 CT1 36 (SUTURE) ×1 IMPLANT
SUT VIC AB 2-0 CT2 27 (SUTURE) ×1 IMPLANT
SUTURE STRATA SPIR 4-0 18 (SUTURE) ×1 IMPLANT
SYR 20ML LL LF (SYRINGE) ×1 IMPLANT
TAPE MICROFOAM 4IN (TAPE) IMPLANT
TRAP FLUID SMOKE EVACUATOR (MISCELLANEOUS) IMPLANT

## 2024-08-28 NOTE — Consult Note (Signed)
 ORTHOPAEDIC CONSULTATION  REQUESTING PHYSICIAN: Dorothyann Drivers, MD  Chief Complaint:   Left hip fracture  History of Present Illness: [name] is a 78 y.o. female with a past medical history of arthritis, hypertension, presents to the emergency department after a fall.  She denies any hip pain prior to the fall.  Denies loss of consciousness during the fall but reports she might of hit her head.  Patient is on Eliquis  for recent DVT and pulmonary embolism which she had a thrombectomy for 7 weeks ago.  Patient took her last dose of Eliquis  last night but did not take any of her medications today.  She endorses left hip pain at this time denies any numbness or tingling in the leg.  Denies any pain at the knee ankle foot or toes.  No pain in other areas at this time.  Past Medical History:  Diagnosis Date   Arthritis    C. difficile diarrhea    Hypertension    Thyroid  disease    Past Surgical History:  Procedure Laterality Date   PERIPHERAL VASCULAR THROMBECTOMY Right 07/05/2024   Procedure: PERIPHERAL VASCULAR THROMBECTOMY;  Surgeon: Marea Selinda RAMAN, MD;  Location: [facility] INVASIVE CV LAB;  Service: Cardiovascular;  Laterality: Right;   PULMONARY THROMBECTOMY N/A 07/03/2024   Procedure: PULMONARY THROMBECTOMY;  Surgeon: Marea Selinda RAMAN, MD;  Location: [facility] INVASIVE CV LAB;  Service: Cardiovascular;  Laterality: N/A;   TONSILLECTOMY     Social History   Socioeconomic History   Marital status: Married    Spouse name: Not on file   Number of children: Not on file   Years of education: Not on file   Highest education level: Not on file  Occupational History   Not on file  Tobacco Use   Smoking status: Every Day    Current packs/day: 1.00    Average packs/day: 1 pack/day for 20.0 years (20.0 ttl pk-yrs)    Types: Cigarettes   Smokeless tobacco: Never  Vaping Use   Vaping status: Never Used  Substance and Sexual  Activity   Alcohol use: No   Drug use: Never   Sexual activity: Not on file  Other Topics Concern   Not on file  Social History Narrative   Not on file   Social Drivers of Health   Financial Resource Strain: Low Risk  (08/21/2024)   Received from [facility]   Overall Financial Resource Strain (CARDIA)    Difficulty of Paying Living Expenses: Not hard at all  Food Insecurity: No Food Insecurity (08/21/2024)   Received from [facility]   Hunger Vital Sign    Within the past 12 months, you worried that your food would run out before you got the money to buy more.: Never true    Within the past 12 months, the food you bought just didn't last and you didn't have money to get more.: Never true  Transportation Needs: Unmet Transportation Needs (08/21/2024)   Received from [facility] - Transportation    In the past 12 months, has lack of transportation kept you from medical appointments or from getting medications?: Yes    Lack of Transportation (Non-Medical): Yes  Physical Activity: Not on file  Stress: Not on file  Social Connections: Socially Integrated (07/01/2024)   Social Connection and Isolation Panel    Frequency of Communication with Friends and Family: Three times a week    Frequency of Social Gatherings with Friends and Family:  Twice a week    Attends Religious Services: 1 to 4 times per year    Active Member of Clubs or Organizations: No    Attends Engineer, structural: 1 to 4 times per year    Marital Status: Married   Family History  Adopted: Yes  Family history unknown: Yes   Allergies  Allergen Reactions   Ciprofloxacin Other (See Comments)    Caused cdiff.   C-Diff   Prior to Admission medications   Medication Sig Start Date End Date Taking? Authorizing Provider  apixaban  (ELIQUIS ) 5 MG TABS tablet Take 1 tablet (5 mg total) by mouth 2 (two) times daily. 07/06/24   Franchot Novel, MD   atorvastatin  (LIPITOR) 10 MG tablet Take 10 mg by mouth daily.    [provider]  cyanocobalamin (VITAMIN B12) 1000 MCG/ML injection Inject 100 mcg into the skin every 30 (thirty) days. 05/04/23   [provider]  escitalopram  (LEXAPRO ) 10 MG tablet Take 10 mg by mouth daily. 10/11/23   [provider]  folic acid (FOLVITE) 1 MG tablet Take 1 mg by mouth daily.    [provider]  hydrOXYzine  (ATARAX ) 10 MG tablet Take 10 mg by mouth 3 (three) times daily.    [provider]  Iron-Vitamin C (VITRON-C) 65-125 MG TABS Take 1 tablet by mouth daily. 07/10/24   [provider]  levothyroxine  (SYNTHROID ) 150 MCG tablet Take by mouth. 11/19/23 11/18/24  [provider]  liothyronine  (CYTOMEL ) 25 MCG tablet Take 25 mcg by mouth daily.    [provider]  loratadine (CLARITIN) 10 MG tablet Take 10 mg by mouth daily as needed.    [provider]  losartan-hydrochlorothiazide (HYZAAR) 50-12.5 MG tablet Take 1 tablet by mouth daily. 04/26/24 04/26/25  [provider]  meclizine  (ANTIVERT ) 25 MG tablet Take 25 mg by mouth 2 (two) times daily as needed. 08/06/23   [provider]  Melatonin 5 MG CAPS Take 5 mg by mouth at bedtime.    [provider]  metoprolol  succinate (TOPROL -XL) 25 MG 24 hr tablet TAKE 1 TABLET(25 MG) BY MOUTH DAILY 06/14/24   Darron Deatrice LABOR, MD  traMADol  (ULTRAM ) 50 MG tablet Take 50 mg by mouth every 6 (six) hours as needed. 10/06/16   [provider]  traZODone (DESYREL) 50 MG tablet Take 50 mg by mouth at bedtime. 05/15/24 05/15/25  [provider]   DG Chest 1 View Result Date: 08/28/2024 EXAM: 1 VIEW(S) XRAY OF THE CHEST 08/28/2024 11:13:00 AM COMPARISON: CTA chest 07/02/2024 and earlier. CLINICAL HISTORY: 78 year old female. Preop. Fall today. Hx of HTN. FINDINGS: LUNGS AND PLEURA: Left lung volume and ventilation appears substantially improved from the abnormal CTA  in August. No confluent lung opacity now. No pulmonary edema. No pleural effusion. No pneumothorax. HEART AND MEDIASTINUM: No acute abnormality of the cardiac and mediastinal silhouettes. BONES AND SOFT TISSUES: No acute osseous abnormality. IMPRESSION: 1. No acute cardiopulmonary abnormality. Electronically signed by: Helayne Hurst MD 08/28/2024 11:36 AM EDT RP Workstation: [id number]   DG HIP UNILAT WITH PELVIS 2-3 VIEWS LEFT Result Date: 08/28/2024 EXAM: 2 or 3 VIEW(S) XRAY OF THE PELVIS AND LEFT HIP 08/28/2024 11:13:00 AM COMPARISON: CT abdomen and pelvis 07/19/2024. CLINICAL HISTORY: 78 year old female. Fall today at home, left hip pain. FINDINGS: JOINTS: SI joints are symmetric. Right hip demonstrates normal alignment. Left hip: Comminuted left femur intertrochanteric fracture varus impaction. Left femoral head appears to remain normally located. SOFT TISSUES: Negative lower  abdominal and pelvic visceral contours. IMPRESSION: 1. Comminuted left intertrochanteric femur fracture with varus impaction. . Electronically signed by: Helayne Hurst MD 08/28/2024 11:35 AM EDT RP Workstation: [id number]   CT HEAD WO CONTRAST ( ) Result Date: 08/28/2024 CLINICAL DATA:  Status post fall. EXAM: CT HEAD WITHOUT CONTRAST TECHNIQUE: Contiguous axial images were obtained from the base of the skull through the vertex without intravenous contrast. RADIATION DOSE REDUCTION: This exam was performed according to the departmental dose-optimization program which includes automated exposure control, adjustment of the mA and/or kV according to patient size and/or use of iterative reconstruction technique. COMPARISON:  October 29, 2023 FINDINGS: Brain: There is generalized cerebral atrophy with widening of the extra-axial spaces and ventricular dilatation. There are areas of decreased attenuation within the white matter tracts of the supratentorial brain, consistent with microvascular disease changes. Vascular: No hyperdense  vessel or unexpected calcification. Skull: Normal. Negative for fracture or focal lesion. Sinuses/Orbits: No acute finding. Other: None. IMPRESSION: 1. No acute intracranial abnormality. 2. 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 Electronically Signed   By: Suzen Dials M.D.   On: 08/28/2024 11:10    Positive ROS: All other systems have been reviewed and were otherwise negative with the exception of those mentioned in the HPI and as above.  Physical Exam: General:  Alert, no acute distress Psychiatric:  Patient is competent for consent with normal mood and affect   Cardiovascular:  No pedal edema Respiratory:  No wheezing, non-labored breathing GI:  Abdomen is soft and non-tender Skin:  No lesions in the area of chief complaint Neurologic:  Sensation intact distally Lymphatic:  No axillary or cervical lymphadenopathy  Orthopedic Exam:  Left lower extremity Shortened and externally rotated Skin intact over the hip tender to palpation Mild swelling over the hip No tenderness over the distal femur, knee, tibia, ankle foot or toes No deformity to the knee ankle foot or toes Compartments all soft Able to dorsiflex and plantarflex the foot and toes intact dorsalis pedis pulse Brisk capillary refill neurovascularly intact  Secondary survey No tenderness to palpation over other bony prominences in the lower extremities or bilateral upper extremities No pain with logroll or simulated axial loading of the right lower extremity All compartments soft No tenderness to palpation over the cervical or thoracic spine, no bony step-off Motor grossly intact throughout, no focal deficits Sensation grossly intact throughout, no focal deficits Good distal pulses and capillary refill on all extremities   X-rays:  X-rays of AP pelvis and left hip images reviewed by myself report above.  There is a comminuted left intertrochanteric femur fracture.   No other fractures noted.  Agree with radiology interpretation.  Assessment: Left intertrochanteric femur fracture  Plan: [name] is a 78 year old female who presents with a comminuted left intertrochanteric femur fracture.  We discussed treatment options including operative and nonoperative interventions.  Given she is an ambulatory patient at baseline we discussed under shared decision making model moving forward with left hip fixation.  After an extensive conversation the patient agreed the plan for open reduction internal fixation of the left hip.  The patient will need a intramedullary nail.  A long discussion took place with the patient describing what a intramedullary nail is and what the procedure would entail. The xrays were reviewed with the patient and the implants were discussed. The ability to secure the implant utilizing screw and blade fixation was discussed. Surgical exposures were discussed with the patient.    The hospitalization  and post-operative care and rehabilitation were also discussed. The use of perioperative antibiotics and DVT prophylaxis were discussed. The risk, benefits and alternatives to a surgical intervention were discussed at length with the patient. The patient was also advised of risks related to the medical comorbidities. A lengthy discussion took place to review the most common complications including but not limited to: deep vein thrombosis, pulmonary embolus, heart attack, stroke, infection, wound breakdown, dislocation, numbness, leg length in-equality, damage to nerves, intraoperative fracture, hardware cut out or failure, malunion/ nonunion, tendon,muscles, arteries or other blood vessels, death and other possible complications from anesthesia. The patient was told that we will take steps to minimize these risks by using sterile technique, antibiotics and DVT prophylaxis when appropriate and follow the patient postoperatively in the office setting to monitor  progress. The possibility of recurrent pain, no improvement in pain and actual worsening of pain were also discussed with the patient.      The benefits of surgery were discussed with the patient including the potential for improving the patient's current clinical condition through operative intervention. Alternatives to surgical intervention including conservative management were also discussed in detail. All questions were answered to the satisfaction of the patient. The patient participated and agreed to the plan of care as well as the use of the recommended implants for their surgery.    Plan for surgery today 08/28/2024 I discussed the case with vascular surgery Dr. Marea and the patient has no current signs or symptoms for DVT.  We discussed doing the nail today and planning for a duplex of the bilateral lower extremities tomorrow to look for any residual thrombus.  No plan for acute IVC filter at this time. N.p.o. for the operating room Hold anticoagulation, plan to restart tomorrow morning.    Arthea Sheer MD  Beeper #:  [phone]  08/28/2024 1:17 PM

## 2024-08-28 NOTE — Op Note (Signed)
 Patient Name: [name]  FMW:[id number]  Pre-Operative Diagnosis: Left hip Intertrochanteric fracture  Post-Operative Diagnosis: (same)  Procedure: Left Hip Intramedullary nail   Components/Implants: Nail:TFNA 170x76mm x130 deg  Lag Blade :90mm Locking Screw:76mm  Date of Surgery: 08/28/2024  Surgeon: Arthea Sheer MD  Assistant: Debby Amber PA (present and scrubbed throughout the case, critical for assistance with exposure, retraction, instrumentation, and closure)   Anesthesiologist: Vicci  Anesthesia: General   EBL: 100cc  Complications: None   Brief history: The patient is a 78 year old female who presented to the [facility] emergency room after a fall and found to have a  left hip intertrochanteric fracture.  The patient was admitted by the medical team and optimized for surgery.  A thorough discussion was had with the patient and family about the risks and benefits of surgical intervention for their hip fracture as definitive treatment.  The patient and family opted to proceed with the operation.  All preoperative films were reviewed and an appropriate surgical plan was made prior to surgery.   Description of procedure: The patient was brought to the operating room where laterality was confirmed by all those present to be the left side.  The patient was administered anesthesia on a stretcher prior to being moved supine on the operating room table. Patient was given an intravenous dose of antibiotics for surgical prophylaxis, TXA was avoided due to recent DVT/PE.  All bony prominences and extremities were well padded and the patient was securely attached to the table boots, a perineal post was placed and the patient had a safety strap placed.  Surgical site was prepped with alcohol and chlorhexidine . The surgical site over the hip was and draped in typical sterile fashion with multiple layers of adhesive and nonadhesive drapes.  The incision site was  marked out with a sterile marker under fluoroscopic guidance.    A surgical timeout was then called with participation of all staff in the room the patient was then a confirmed again and laterality confirmed. The hip fracture was reduced through indirect measures with traction and rotation of the leg on the fracture table. After an acceptable reduction was obtained on AP and lateral images the procedure started.  An incision was made just proximal to the greater trochanter through the skin subcutaneous tissues and an incision was made in the glut max fascia.  A guidewire was placed through the greater trochanter at the tip under x-ray guidance into the intertrochanteric region.  The position of this wire was assessed on AP and lateral fluoroscopic images to ensure position.  An opening reamer was used to create access at the tip of the greater trochanter under fluoroscopic guidance.   A size 170x9 nail was advanced through the reamed hole in the proximal femur and seated within the femur to an appropriate depth under fluoroscopic guidance.  The aiming jig was used to mark an incision site for a lag blade to the femoral head which was then incised with a scalpel.  A wire was then advanced through the lateral cortex of the femur into the center of the femoral head on both AP and lateral fluoroscopic imaging stopping at the subchondral bone without penetration of the femoral head.  This wire was then used to measure for a lag screw and a size 90 lag blade was inserted into the femoral head through the lateral cortex of the femur and the nail under fluoroscopic guidance.  The setscrew was then tightened in the proximal part of  the nail and engaged with the lag blade with a small amount of play left so as to allow compression of the fracture.   The lag screw driver was removed and the aiming jig was switched for the distal interlocking screw.  A drill was used to drill a hole for the distal interlocking screw  under fluoroscopic guidance and a size 36 screw was placed.  The intramedullary nail was assessed on AP and lateral fluoroscopic guidance prior to removal of the aiming jig.  Final fluoroscopic x-rays were then taken after removal of the jig.  The nail was found to be in appropriate position on AP and lateral imaging with appropriate lengths of both the lag screw in the distal locking screw.  The fascia was closed with 0 Vicryl interrupted figure-of-eight sutures.  The subcutaneous tissues were closed with 2-0 Vicryl and the skin closed with 3-0 Monocryl and Dermabond.  Sterile dressings were applied to the incisions.   The patient was awoken from anesthesia transferred off of the operating room table onto a hospital bed.  The patient had a good pulse postoperatively in the foot . the patient was then transferred to the PACU in stable condition.

## 2024-08-28 NOTE — H&P (Incomplete)
 History and Physical    [name] FMW:[id number] DOB: 1946-02-17 DOA: 08/28/2024  DOS: the patient was seen and examined on 08/28/2024  PCP: Cleotilde Oneil FALCON, MD   Patient coming from: Home  I have personally briefly reviewed patient's old medical records in [facility] and CareEverywhere  HPI:   [name] is a 78 y.o. year old female with past medical history of PE in 06/2024 on eliquis , hypothyroidism, MDD, GAD presenting to the ED after a hall.   Pt states felt dizzy and started sweating in her head and was to walk to get to her husband and fell prior to reaching him. Pt states she was not eating well for a couple of days prior as she wasn't hungry. States tried to keep up her oral intake.    ED Course: On arrival to the ED patient was noted to be HDS stable. Imaging was obtained that showed pt with comminuted left intertrochanteric femur fracture. Lab work showed an AKI along with hypokalemia and CBC with mild leukocytosis. EDP spoke with orthopedic surgery and potential for OR today.   TRH contacted for admission.  Review of Systems: As mentioned in the history of present illness. All other systems reviewed and are negative.   Past Medical History:  Diagnosis Date   Arthritis    C. difficile diarrhea    Hypertension    Thyroid  disease     Past Surgical History:  Procedure Laterality Date   PERIPHERAL VASCULAR THROMBECTOMY Right 07/05/2024   Procedure: PERIPHERAL VASCULAR THROMBECTOMY;  Surgeon: Marea Selinda RAMAN, MD;  Location: [facility] INVASIVE CV LAB;  Service: Cardiovascular;  Laterality: Right;   PULMONARY THROMBECTOMY N/A 07/03/2024   Procedure: PULMONARY THROMBECTOMY;  Surgeon: Marea Selinda RAMAN, MD;  Location: [facility] INVASIVE CV LAB;  Service: Cardiovascular;  Laterality: N/A;   TONSILLECTOMY       Allergies  Allergen Reactions   Ciprofloxacin Other (See Comments)    Caused cdiff.   C-Diff    Family History  Adopted: Yes  Family history unknown: Yes     Prior to Admission medications   Medication Sig Start Date End Date Taking? Authorizing Provider  apixaban  (ELIQUIS ) 5 MG TABS tablet Take 1 tablet (5 mg total) by mouth 2 (two) times daily. 07/06/24   Franchot Novel, MD  atorvastatin  (LIPITOR) 10 MG tablet Take 10 mg by mouth daily.    [provider]  cyanocobalamin (VITAMIN B12) 1000 MCG/ML injection Inject 100 mcg into the skin every 30 (thirty) days. 05/04/23   [provider]  escitalopram  (LEXAPRO ) 10 MG tablet Take 10 mg by mouth daily. 10/11/23   [provider]  folic acid (FOLVITE) 1 MG tablet Take 1 mg by mouth daily.    [provider]  hydrOXYzine  (ATARAX ) 10 MG tablet Take 10 mg by mouth 3 (three) times daily.    [provider]  Iron-Vitamin C (VITRON-C) 65-125 MG TABS Take 1 tablet by mouth daily. 07/10/24   [provider]  levothyroxine  (SYNTHROID ) 150 MCG tablet Take by mouth. 11/19/23 11/18/24  [provider]  liothyronine  (CYTOMEL ) 25 MCG tablet Take 25 mcg by mouth daily.    [provider]  loratadine (CLARITIN) 10 MG tablet Take 10 mg by mouth daily as needed.    [provider]  losartan-hydrochlorothiazide (HYZAAR) 50-12.5 MG tablet Take 1 tablet by mouth daily. 04/26/24 04/26/25  [provider]  meclizine  (ANTIVERT ) 25 MG tablet Take 25 mg by mouth 2 (two) times daily  as needed. 08/06/23   [provider]  Melatonin 5 MG CAPS Take 5 mg by mouth at bedtime.    [provider]  metoprolol  succinate (TOPROL -XL) 25 MG 24 hr tablet TAKE 1 TABLET(25 MG) BY MOUTH DAILY 06/14/24   Darron Deatrice LABOR, MD  traMADol  (ULTRAM ) 50 MG tablet Take 50 mg by mouth every 6 (six) hours as needed. 10/06/16   [provider]  traZODone (DESYREL) 50 MG tablet Take 50 mg by mouth at bedtime. 05/15/24 05/15/25  [provider]      reports that she has been smoking cigarettes. She has a 20 pack-year smoking history. She  has never used smokeless tobacco. She reports that she does not drink alcohol and does not use drugs. Lives with husband Tobacco- 1 ppd 30 years EtOH- Denies use.  Illicit drug use- denies use.  IADLs/ADLs- can person independently at baseline    Physical Exam: Vitals:   08/28/24 1755 08/28/24 1800 08/28/24 1832 08/28/24 2036  BP:  136/79 116/72 129/80  Pulse: (!) 105 (!) 105 (!) 113 96  Resp: 20 (!) 28 14 17   Temp:  (!) 97 F (36.1 C) 99.4 F (37.4 C) 99.1 F (37.3 C)  TempSrc:   Oral Oral  SpO2: 98% 97% 99% 96%  Weight:      Height:       Gen: NAD HENT: NCAT CV: sinus tachycardia Resp: CTAB Abd:No TTP, good bowel sounds MSK: moving all extremities Neuro: alert and oriented x4 Psych: pleasant mood   Labs on Admission: I have personally reviewed following labs and imaging studies  CBC: Recent Labs  Lab 08/28/24 1045  WBC 13.6*  HGB 14.7  HCT 45.7  MCV 89.3  PLT 264   Basic Metabolic Panel: Recent Labs  Lab 08/28/24 1045 08/28/24 1454  NA 141  --   K 3.1*  --   CL 100  --   CO2 23  --   GLUCOSE 131*  --   BUN 34*  --   CREATININE 1.39*  --   CALCIUM  9.1  --   MG  --  2.4   GFR: Estimated Creatinine Clearance: 37.6 mL/min (A) (by C-G formula based on SCr of 1.39 mg/dL (H)). Liver Function Tests: No results for input(s): AST, ALT, ALKPHOS, BILITOT, PROT, ALBUMIN in the last 168 hours. No results for input(s): LIPASE, AMYLASE in the last 168 hours. No results for input(s): AMMONIA in the last 168 hours. Coagulation Profile: Recent Labs  Lab 08/28/24 1454  INR 1.0   Cardiac Enzymes: No results for input(s): CKTOTAL, CKMB, CKMBINDEX, TROPONINI, TROPONINIHS in the last 168 hours. BNP (last 3 results) No results for input(s): BNP in the last 8760 hours. HbA1C: No results for input(s): HGBA1C in the last 72 hours. CBG: No results for input(s): GLUCAP in the last 168 hours. Lipid Profile: No results for input(s):  CHOL, HDL, LDLCALC, TRIG, CHOLHDL, LDLDIRECT in the last 72 hours. Thyroid  Function Tests: No results for input(s): TSH, T4TOTAL, FREET4, T3FREE, THYROIDAB in the last 72 hours. Anemia Panel: No results for input(s): VITAMINB12, FOLATE, FERRITIN, TIBC, IRON, RETICCTPCT in the last 72 hours. Urine analysis:    Component Value Date/Time   COLORURINE Yellow 07/16/2013 1121   APPEARANCEUR Hazy 07/16/2013 1121   LABSPEC 1.025 07/16/2013 1121   PHURINE 5.0 07/16/2013 1121   GLUCOSEU Negative 07/16/2013 1121   HGBUR Negative 07/16/2013 1121   BILIRUBINUR Negative 07/16/2013 1121   KETONESUR 2+ 07/16/2013 1121   PROTEINUR 30 mg/dL  07/16/2013 1121   NITRITE Negative 07/16/2013 1121   LEUKOCYTESUR Trace 07/16/2013 1121    Radiological Exams on Admission: I have personally reviewed images DG HIP UNILAT WITH PELVIS 2-3 VIEWS LEFT Result Date: 08/28/2024 CLINICAL DATA:  Elective surgery. EXAM: DG HIP (WITH OR WITHOUT PELVIS) 2-3V LEFT COMPARISON:  Radiograph earlier today FINDINGS: Six fluoroscopic spot views of the left hip submitted from the operating room. Femoral intramedullary nail with trans trochanteric and distal locking screw fixation traverse proximal femur fracture. Fluoroscopy time 1 minutes 34 seconds. Dose 27.78 mGy. IMPRESSION: Intraoperative fluoroscopy during left proximal femur fracture ORIF. Electronically Signed   By: Andrea Gasman M.D.   On: 08/28/2024 17:06   DG C-Arm 1-60 Min-No Report Result Date: 08/28/2024 Fluoroscopy was utilized by the requesting physician.  No radiographic interpretation.   DG Chest 1 View Result Date: 08/28/2024 EXAM: 1 VIEW(S) XRAY OF THE CHEST 08/28/2024 11:13:00 AM COMPARISON: CTA chest 07/02/2024 and earlier. CLINICAL HISTORY: 78 year old female. Preop. Fall today. Hx of HTN. FINDINGS: LUNGS AND PLEURA: Left lung volume and ventilation appears substantially improved from the abnormal CTA in August. No confluent  lung opacity now. No pulmonary edema. No pleural effusion. No pneumothorax. HEART AND MEDIASTINUM: No acute abnormality of the cardiac and mediastinal silhouettes. BONES AND SOFT TISSUES: No acute osseous abnormality. IMPRESSION: 1. No acute cardiopulmonary abnormality. Electronically signed by: Helayne Hurst MD 08/28/2024 11:36 AM EDT RP Workstation: [id number]   DG HIP UNILAT WITH PELVIS 2-3 VIEWS LEFT Result Date: 08/28/2024 EXAM: 2 or 3 VIEW(S) XRAY OF THE PELVIS AND LEFT HIP 08/28/2024 11:13:00 AM COMPARISON: CT abdomen and pelvis 07/19/2024. CLINICAL HISTORY: 78 year old female. Fall today at home, left hip pain. FINDINGS: JOINTS: SI joints are symmetric. Right hip demonstrates normal alignment. Left hip: Comminuted left femur intertrochanteric fracture varus impaction. Left femoral head appears to remain normally located. SOFT TISSUES: Negative lower abdominal and pelvic visceral contours. IMPRESSION: 1. Comminuted left intertrochanteric femur fracture with varus impaction. . Electronically signed by: Helayne Hurst MD 08/28/2024 11:35 AM EDT RP Workstation: [id number]   CT HEAD WO CONTRAST ( ) Result Date: 08/28/2024 CLINICAL DATA:  Status post fall. EXAM: CT HEAD WITHOUT CONTRAST TECHNIQUE: Contiguous axial images were obtained from the base of the skull through the vertex without intravenous contrast. RADIATION DOSE REDUCTION: This exam was performed according to the departmental dose-optimization program which includes automated exposure control, adjustment of the mA and/or kV according to patient size and/or use of iterative reconstruction technique. COMPARISON:  October 29, 2023 FINDINGS: Brain: There is generalized cerebral atrophy with widening of the extra-axial spaces and ventricular dilatation. There are areas of decreased attenuation within the white matter tracts of the supratentorial brain, consistent with microvascular disease changes. Vascular: No hyperdense vessel or unexpected  calcification. Skull: Normal. Negative for fracture or focal lesion. Sinuses/Orbits: No acute finding. Other: None. IMPRESSION: 1. No acute intracranial abnormality. 2. 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 Electronically Signed   By: Suzen Dials M.D.   On: 08/28/2024 11:10    EKG: My personal interpretation of EKG shows: normal sinus rhythm without any acute ST changes.   Assessment/Plan Principal Problem:   Closed comminuted intertrochanteric fracture of left femur (HCC) Active Problems:   Acquired hypothyroidism   Benign essential hypertension   Chronic systolic CHF (congestive heart failure) (HCC)   Nonischemic cardiomyopathy (HCC)  Pt presented after a fall and found to have a right intertrochanteric femur fracture. Ortho consulted by EDP. Appreciate their assistance.  Started pt on pain regimen. Incentive spirometry. Can escalate pain regimen as needed. Bowel regimen ordered prn. Getting fixation today. Fall appears secondary to dehydration mediated presyncope.   Chronic Medications: Acquired hypothyroidism: continue home synthroid .  HTN: continue home beta blocker, hold HCTZ CHF: continue home beta blocker Hx of DVT/PE: restart home AC 24 hours post surgery.   VTE prophylaxis:  SQ Heparin   Diet: NPO Code Status:  Full Code Telemetry:  Admission status: Inpatient, Telemetry bed Patient is from: Home  Anticipated d/c is to: TBD/SNF  Anticipated d/c is in: 2-3 days   Family Communication: Updated at bedside   Consults called: Orthopedic surgery    Severity of Illness: The appropriate patient status for this patient is INPATIENT. Inpatient status is judged to be reasonable and necessary in order to provide the required intensity of service to ensure the patient's safety. The patient's presenting symptoms, physical exam findings, and initial radiographic and laboratory data in the context of their chronic  comorbidities is felt to place them at high risk for further clinical deterioration. Furthermore, it is not anticipated that the patient will be medically stable for discharge from the hospital within 2 midnights of admission.   * I certify that at the point of admission it is my clinical judgment that the patient will require inpatient hospital care spanning beyond 2 midnights from the point of admission due to high intensity of service, high risk for further deterioration and high frequency of surveillance required.DEWAINE Morene Bathe, MD Jolynn DEL. [facility]

## 2024-08-28 NOTE — Transfer of Care (Signed)
 Immediate Anesthesia Transfer of Care Note  Patient: [name]  Procedure(s) Performed: FIXATION, FRACTURE, INTERTROCHANTERIC, WITH INTRAMEDULLARY ROD (Left: Hip)  Patient Location: PACU  Anesthesia Type:General  Level of Consciousness: drowsy  Airway & Oxygen Therapy: Patient Spontanous Breathing and Patient connected to face mask oxygen  Post-op Assessment: Report given to RN  Post vital signs: stable  Last Vitals:  Vitals Value Taken Time  BP    Temp    Pulse    Resp    SpO2      Last Pain:  Vitals:   08/28/24 1413  TempSrc:   PainSc: 6          Complications: There were no known notable events for this encounter.

## 2024-08-28 NOTE — ED Provider Notes (Signed)
 [facility] Provider Note    Event Date/Time   First MD Initiated Contact with Patient 08/28/24 1012     (approximate)  History   Chief Complaint: Fall  HPI  [name] is a 78 y.o. female with a past medical history of arthritis, hypertension, presents to the emergency department after a fall.  According to the patient she tripped and fell today landing on her left side.  Patient states pain to her left hip was unable to stand after the fall and called EMS.  Patient believes she possibly hit her head denies LOC.  Patient is on Eliquis  for prior PE.  Physical Exam   Triage Vital Signs: ED Triage Vitals [08/28/24 1017]  Encounter Vitals Group     BP      Girls Systolic BP Percentile      Girls Diastolic BP Percentile      Boys Systolic BP Percentile      Boys Diastolic BP Percentile      Pulse      Resp      Temp      Temp src      SpO2      Weight      Height      Head Circumference      Peak Flow      Pain Score 10     Pain Loc      Pain Education      Exclude from Growth Chart     Most recent vital signs: There were no vitals filed for this visit.  General: Awake, no distress.  Awake alert oriented x 4 answering all questions appropriately. CV:  Good peripheral perfusion.  Regular rate and rhythm  Resp:  Normal effort.  Equal breath sounds bilaterally.  Abd:  No distention.  Soft, nontender.  No rebound or guarding. Other:  Moderate pain in the left hip with any attempted range of motion.  Neurovascularly intact distally 2+ DP pulse.   ED Results / Procedures / Treatments   EKG  EKG viewed and interpreted by myself shows what appears to be a sinus rhythm at 78 bpm with a narrow QRS, normal axis, normal intervals, no concerning ST changes.  RADIOLOGY  I have reviewed the patient's left hip x-rays patient appears to have a left intertrochanteric fracture. Radiology has confirmed comminuted left intertrochanteric femur  fracture. CT scan of the head shows no acute finding. Chest x-ray shows no acute finding.  MEDICATIONS ORDERED IN ED: Medications  fentaNYL  (SUBLIMAZE ) injection 100 mcg (has no administration in time range)  ondansetron  (ZOFRAN ) injection 4 mg (has no administration in time range)     IMPRESSION / MDM / ASSESSMENT AND PLAN / ED COURSE  I reviewed the triage vital signs and the nursing notes.  Patient's presentation is most consistent with acute presentation with potential threat to life or bodily function.  Patient presents the emergency department after a fall with left hip pain.  Patient has pain and tenderness to the left hip.  We will obtain x-ray images will obtain a CT scan of the head as a precaution.  Will check labs and EKG for preop purposes.  Patient agreeable to plan of care.  Currently patient appears well answering all questions appropriately.  Will dose pain medication prior to imaging.  Lab work shows a reassuring CBC reassuring chemistry besides signs of mild dehydration we will IV hydrate.  Patient's x-ray unfortunately shows a left intertrochanteric fracture.  I  spoke to Dr. Lorelle of orthopedics we will plan on fixing later this afternoon.  Patient takes Eliquis  but last dose was last night n.p.o. today.  I have confirmed n.p.o. status in the emergency department.  FINAL CLINICAL IMPRESSION(S) / ED DIAGNOSES   Fall Left hip pain Left hip fracture  Note:  This document was prepared using Dragon voice recognition software and may include unintentional dictation errors.   Dorothyann Drivers, MD 08/28/24 1247

## 2024-08-28 NOTE — Progress Notes (Signed)
 MEWS Progress Note  Patient Details Name: [name] MRN: [id number] DOB: 1946/05/26 Today's Date: 08/28/2024   MEWS Flowsheet Documentation:  Assess: MEWS Score Temp: 99.4 F (37.4 C) BP: 116/72 MAP (mmHg): 95 Pulse Rate: (!) 113 ECG Heart Rate: (!) 106 Resp: 14 Level of Consciousness: Alert SpO2: 99 % O2 Device: Room Air Patient Activity (if Appropriate): In bed O2 Flow Rate (L/min): 2 L/min Assess: MEWS Score MEWS Temp: 0 MEWS Systolic: 0 MEWS Pulse: 2 MEWS RR: 0 MEWS LOC: 0 MEWS Score: 2 MEWS Score Color: Yellow Assess: SIRS CRITERIA SIRS Temperature : 0 SIRS Respirations : 0 SIRS Pulse: 1 SIRS WBC: 1 SIRS Score Sum : 2 SIRS Temperature : 0 SIRS Pulse: 1 SIRS Respirations : 0 SIRS WBC: 1 SIRS Score Sum : 2 Assess: if the MEWS score is Yellow or Red Were vital signs accurate and taken at a resting state?: Yes Does the patient meet 2 or more of the SIRS criteria?: No MEWS guidelines implemented : Yes, yellow Treat MEWS Interventions: Considered administering scheduled or prn medications/treatments as ordered Take Vital Signs Increase Vital Sign Frequency : Yellow: Q2hr x1, continue Q4hrs until patient remains green for 12hrs Escalate MEWS: Escalate: Yellow: Discuss with charge nurse and consider notifying provider and/or RRT        [name] 08/28/2024, 7:40 PM

## 2024-08-28 NOTE — ED Notes (Signed)
Patient changed into hospital gown.

## 2024-08-28 NOTE — Anesthesia Procedure Notes (Signed)
 Procedure Name: Intubation Date/Time: 08/28/2024 3:48 PM  Performed by: Norleen Alberta HERO., CRNAPre-anesthesia Checklist: Patient identified, Patient being monitored, Timeout performed, Emergency Drugs available and Suction available Patient Re-evaluated:Patient Re-evaluated prior to induction Oxygen Delivery Method: Circle system utilized Preoxygenation: Pre-oxygenation with 100% oxygen Induction Type: IV induction and Rapid sequence Ventilation: Mask ventilation without difficulty Laryngoscope Size: 3 and McGrath Grade View: Grade I Tube type: Oral Tube size: 6.5 mm Number of attempts: 1 Airway Equipment and Method: Stylet Placement Confirmation: ETT inserted through vocal cords under direct vision, positive ETCO2 and breath sounds checked- equal and bilateral Secured at: 19 cm Tube secured with: Tape Dental Injury: Teeth and Oropharynx as per pre-operative assessment

## 2024-08-28 NOTE — ED Triage Notes (Signed)
 Pt to ED via [facility] from home for c/o fall from standing position. Pt on Eliquis , denies hitting head, denies LOC. Pain in left hip. Shortening and rotation noted by EMS.

## 2024-08-28 NOTE — Anesthesia Preprocedure Evaluation (Signed)
 Anesthesia Evaluation  Patient identified by MRN, date of birth, ID band Patient awake    Reviewed: Allergy & Precautions, H&P , NPO status , Patient's Chart, lab work & pertinent test results, reviewed documented beta blocker date and time   Airway Mallampati: II  TM Distance: >3 FB Neck ROM: full    Dental  (+) Poor Dentition, Missing, Chipped,    Pulmonary Current Smoker   Pulmonary exam normal        Cardiovascular Exercise Tolerance: Poor hypertension, Pt. on home beta blockers and Pt. on medications +CHF  Normal cardiovascular exam  ECHO 8/25:  1. Left ventricular ejection fraction, by estimation, is 55 to 60%. The  left ventricle has normal function. The left ventricle has no regional  wall motion abnormalities. There is mild left ventricular hypertrophy.  Left ventricular diastolic parameters  are consistent with Grade I diastolic dysfunction (impaired relaxation).  The average left ventricular global longitudinal strain is -20.4 %. The  global longitudinal strain is normal.   2. Right ventricular systolic function is normal. The right ventricular  size is normal. Tricuspid regurgitation signal is inadequate for assessing  PA pressure.   3. The mitral valve is normal in structure. No evidence of mitral valve  regurgitation. No evidence of mitral stenosis.   4. The aortic valve is normal in structure. Aortic valve regurgitation is  not visualized. Aortic valve sclerosis/calcification is present, without  any evidence of aortic stenosis.     Neuro/Psych negative neurological ROS  negative psych ROS   GI/Hepatic negative GI ROS, Neg liver ROS,,,  Endo/Other  Hypothyroidism    Renal/GU      Musculoskeletal  (+) Arthritis ,    Abdominal Normal abdominal exam  (+)   Peds  Hematology negative hematology ROS (+)   Anesthesia Other Findings Pt fell after being dizzy and sweaty. Reports episodes of having a  fast hear rate.   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 difficile diarrhea No date: Hypertension No date: Thyroid  disease  Past Surgical History: 07/05/2024: PERIPHERAL VASCULAR THROMBECTOMY; Right     Comment:  Procedure: PERIPHERAL VASCULAR THROMBECTOMY;  Surgeon:               Marea Selinda RAMAN, MD;  Location: [facility] INVASIVE CV LAB;                Service: Cardiovascular;  Laterality: Right; 07/03/2024: PULMONARY THROMBECTOMY; N/A     Comment:  Procedure: PULMONARY THROMBECTOMY;  Surgeon: Marea Selinda RAMAN, MD;  Location: [facility] INVASIVE CV LAB;  Service:               Cardiovascular;  Laterality: N/A; No date: TONSILLECTOMY  BMI    Body Mass Index: 29.86 kg/m      Reproductive/Obstetrics negative OB ROS                              Anesthesia Physical Anesthesia Plan  ASA: 3  Anesthesia Plan: General ETT and General   Post-op Pain Management: Ofirmev  IV (intra-op)*   Induction: Intravenous  PONV Risk Score and Plan: 2 and Ondansetron  and Dexamethasone  Airway Management Planned: Oral ETT  Additional Equipment:   Intra-op Plan:  Post-operative Plan: Extubation in OR  Informed Consent: I have reviewed the patients History and Physical, chart, labs and discussed the procedure including the risks, benefits and alternatives for the proposed anesthesia with the patient or authorized representative who has indicated his/her understanding and acceptance.     Dental Advisory Given  Plan Discussed with: CRNA and Surgeon  Anesthesia Plan Comments:          Anesthesia Quick Evaluation

## 2024-08-29 ENCOUNTER — Encounter: Payer: Self-pay | Admitting: Orthopedic Surgery

## 2024-08-29 DIAGNOSIS — S72143A Displaced intertrochanteric fracture of unspecified femur, initial encounter for closed fracture: Secondary | ICD-10-CM | POA: Diagnosis not present

## 2024-08-29 LAB — CBC
HCT: 39.8 % (ref 36.0–46.0)
Hemoglobin: 13.2 g/dL (ref 12.0–15.0)
MCH: 29.1 pg (ref 26.0–34.0)
MCHC: 33.2 g/dL (ref 30.0–36.0)
MCV: 87.7 fL (ref 80.0–100.0)
Platelets: 226 K/uL (ref 150–400)
RBC: 4.54 MIL/uL (ref 3.87–5.11)
RDW: 13.2 % (ref 11.5–15.5)
WBC: 13.3 K/uL — ABNORMAL HIGH (ref 4.0–10.5)
nRBC: 0 % (ref 0.0–0.2)

## 2024-08-29 LAB — BASIC METABOLIC PANEL WITH GFR
Anion gap: 8 (ref 5–15)
BUN: 34 mg/dL — ABNORMAL HIGH (ref 8–23)
CO2: 26 mmol/L (ref 22–32)
Calcium: 8.9 mg/dL (ref 8.9–10.3)
Chloride: 106 mmol/L (ref 98–111)
Creatinine, Ser: 1.25 mg/dL — ABNORMAL HIGH (ref 0.44–1.00)
GFR, Estimated: 45 mL/min — ABNORMAL LOW (ref >60)
Glucose, Bld: 151 mg/dL — ABNORMAL HIGH (ref 70–99)
Potassium: 5.1 mmol/L (ref 3.5–5.1)
Sodium: 140 mmol/L (ref 135–145)

## 2024-08-29 LAB — VITAMIN D 25 HYDROXY (VIT D DEFICIENCY, FRACTURES): Vit D, 25-Hydroxy: 17.54 ng/mL — ABNORMAL LOW (ref 30–100)

## 2024-08-29 MED ORDER — TRAZODONE HCL 50 MG PO TABS
50.0000 mg | ORAL_TABLET | Freq: Every day | ORAL | Status: DC
  Administered 2024-08-29 – 2024-08-30 (×2): 50 mg via ORAL
  Filled 2024-08-29 (×3): qty 1

## 2024-08-29 MED ORDER — POLYETHYLENE GLYCOL 3350 17 G PO PACK
17.0000 g | PACK | Freq: Two times a day (BID) | ORAL | Status: DC
  Filled 2024-08-29 (×3): qty 1

## 2024-08-29 MED ORDER — LEVOTHYROXINE SODIUM 50 MCG PO TABS
150.0000 ug | ORAL_TABLET | Freq: Every day | ORAL | Status: DC
  Administered 2024-08-29 – 2024-08-31 (×3): 150 ug via ORAL
  Filled 2024-08-29 (×3): qty 3

## 2024-08-29 MED ORDER — FOLIC ACID 1 MG PO TABS
1.0000 mg | ORAL_TABLET | Freq: Every day | ORAL | Status: DC
  Administered 2024-08-29 – 2024-08-31 (×3): 1 mg via ORAL
  Filled 2024-08-29 (×3): qty 1

## 2024-08-29 MED ORDER — ATORVASTATIN CALCIUM 10 MG PO TABS
10.0000 mg | ORAL_TABLET | Freq: Every day | ORAL | Status: DC
  Administered 2024-08-29 – 2024-08-31 (×3): 10 mg via ORAL
  Filled 2024-08-29 (×3): qty 1

## 2024-08-29 MED ORDER — METOPROLOL SUCCINATE ER 25 MG PO TB24
25.0000 mg | ORAL_TABLET | Freq: Every day | ORAL | Status: DC
  Administered 2024-08-29: 25 mg via ORAL
  Filled 2024-08-29: qty 1

## 2024-08-29 MED ORDER — ESCITALOPRAM OXALATE 10 MG PO TABS
10.0000 mg | ORAL_TABLET | Freq: Every day | ORAL | Status: DC
  Administered 2024-08-29 – 2024-08-31 (×3): 10 mg via ORAL
  Filled 2024-08-29 (×3): qty 1

## 2024-08-29 NOTE — Progress Notes (Signed)
   Subjective: 1 Day Post-Op Procedure(s) (LRB): FIXATION, FRACTURE, INTERTROCHANTERIC, WITH INTRAMEDULLARY ROD (Left) Patient reports pain as mild.   Patient is well, and has had no acute complaints or problems Denies any CP, SOB, ABD pain. We will continue therapy today.  Plan is to go Home after hospital stay.  Objective: Vital signs in last 24 hours: Temp:  [96.8 F (36 C)-99.4 F (37.4 C)] 98.2 F (36.8 C) (10/14 0722) Pulse Rate:  [77-113] 81 (10/14 0722) Resp:  [12-28] 17 (10/14 0722) BP: (106-142)/(61-100) 129/69 (10/14 0722) SpO2:  [86 %-99 %] 95 % (10/14 0722) Weight:  [83.9 kg] 83.9 kg (10/13 1021)  Intake/Output from previous day: 10/13 0701 - 10/14 0700 In: 1020 [P.O.:120; I.V.:800; IV Piggyback:100] Out: 300 [Urine:300] Intake/Output this shift: No intake/output data recorded.  Recent Labs    08/28/24 1045 08/29/24 0541  HGB 14.7 13.2   Recent Labs    08/28/24 1045 08/29/24 0541  WBC 13.6* 13.3*  RBC 5.12* 4.54  HCT 45.7 39.8  PLT 264 226   Recent Labs    08/28/24 1045 08/29/24 0541  NA 141 140  K 3.1* 5.1  CL 100 106  CO2 23 26  BUN 34* 34*  CREATININE 1.39* 1.25*  GLUCOSE 131* 151*  CALCIUM  9.1 8.9   Recent Labs    08/28/24 1454  INR 1.0    EXAM General - Patient is Alert, Appropriate, and Oriented Extremity - Neurovascular intact Sensation intact distally Intact pulses distally Dorsiflexion/Plantar flexion intact No cellulitis present Compartment soft Dressing - dressing C/D/I and no drainage Motor Function - intact, moving foot and toes well on exam.   Past Medical History:  Diagnosis Date   Arthritis    C. difficile diarrhea    Hypertension    Thyroid  disease     Assessment/Plan:   1 Day Post-Op Procedure(s) (LRB): FIXATION, FRACTURE, INTERTROCHANTERIC, WITH INTRAMEDULLARY ROD (Left) Principal Problem:   Closed comminuted intertrochanteric fracture of femur, initial encounter (HCC) Active Problems:   Acquired  hypothyroidism   Benign essential hypertension   Chronic systolic CHF (congestive heart failure) (HCC)   Nonischemic cardiomyopathy (HCC)  Estimated body mass index is 29.86 kg/m as calculated from the following:   Height as of this encounter: 5' 6 (1.676 m).   Weight as of this encounter: 83.9 kg. Advance diet Up with therapy Pain well controlled Labs and VSS Resume Eliquis , will order BLE Venous US  Wednesday to confirm no DVT CM to assist with discharge to home with HHPT   DVT Prophylaxis - TED hose and SCDS Eliquis  Weight-Bearing as tolerated to left leg   T. Medford Amber, PA-C [facility] 08/29/2024, 8:21 AM

## 2024-08-29 NOTE — Progress Notes (Signed)
 PROGRESS NOTE AEVAH STANSBERY    DOB: 1946/05/11, 78 y.o.  FMW:[id number]    Code Status: Full Code   DOA: 08/28/2024   LOS: 1  Brief hospital course  [name] is a 78 y.o. female with a PMH significant for PE in 06/2024 on eliquis , hypothyroidism, MDD, GAD presenting to the ED after a fall.     ED Course: VS stable. Lab work showed an AKI along with hypokalemia and CBC with mild leukocytosis.  Xray: comminuted left intertrochanteric femur fracture.  Ortho surgery was consulted and performed ORIF 10/13.  Patient is recovering in expected fashion.   08/29/24 -PT/OT evaluated and recommend SNF.  Assessment & Plan  Principal Problem:   Closed comminuted intertrochanteric fracture of femur, initial encounter [facility] Active Problems:   Acquired hypothyroidism   Benign essential hypertension   Chronic systolic CHF (congestive heart failure) (HCC)   Nonischemic cardiomyopathy (HCC)  Right intertrochanteric femur fracture- s/p ORIF 10/13 - continue PT/OT - analgesia PRN.  - ortho following  AKI- improving today. Baseline around 0.8. today is 1.25 - BMP am   Chronic Medications: Acquired hypothyroidism: continue home synthroid .  HTN: continue home beta blocker, hold HCTZ CHF: continue home beta blocker Hx of DVT/PE: restarted eliquis   Body mass index is 29.86 kg/m.  VTE ppx: Place and maintain sequential compression device Start: 08/29/24 0213 SCDs Start: 08/28/24 1828 SCDs Start: 08/28/24 1340 apixaban  (ELIQUIS ) tablet 5 mg   Diet:     Diet   Diet regular Room service appropriate? Yes; Fluid consistency: Thin   Consultants: Ortho   Subjective 08/29/24    Pt reports feeling fine. Denies pain at rest.   Objective  Blood pressure 129/69, pulse 81, temperature 98.2 F (36.8 C), resp. rate 17, height 5' 6 (1.676 m), weight 83.9 kg, SpO2 95%.  Intake/Output Summary (Last 24 hours) at 08/29/2024 0753 Last data filed at 08/29/2024 0500 Gross per 24 hour  Intake  1020 ml  Output 300 ml  Net 720 ml   Filed Weights   08/28/24 1021  Weight: 83.9 kg     Physical Exam:  General: awake, alert, NAD HEENT: left eye patch in place Respiratory: normal respiratory effort. Cardiovascular: extremities well perfused Nervous: A&O x3. no gross focal neurologic deficits, normal speech Extremities: surgical incision healing well Skin: dry, intact, normal temperature, normal color. No rashes, lesions or ulcers on exposed skin Psychiatry: normal mood, congruent affect  Labs   I have personally reviewed the following labs and imaging studies CBC    Component Value Date/Time   WBC 13.3 (H) 08/29/2024 0541   RBC 4.54 08/29/2024 0541   HGB 13.2 08/29/2024 0541   HGB 13.2 07/17/2013 0413   HCT 39.8 08/29/2024 0541   HCT 37.9 07/17/2013 0413   PLT 226 08/29/2024 0541   PLT 163 07/17/2013 0413   MCV 87.7 08/29/2024 0541   MCV 92 07/17/2013 0413   MCH 29.1 08/29/2024 0541   MCHC 33.2 08/29/2024 0541   RDW 13.2 08/29/2024 0541   RDW 13.1 07/17/2013 0413   LYMPHSABS 1.7 07/17/2013 0413   MONOABS 1.4 (H) 07/17/2013 0413   EOSABS 0.3 07/17/2013 0413   BASOSABS 0.0 07/17/2013 0413      Latest Ref Rng & Units 08/29/2024    5:41 AM 08/28/2024   10:45 AM 07/06/2024    5:24 AM  BMP  Glucose 70 - 99 mg/dL 848  868  897   BUN 8 - 23 mg/dL 34  34  21  Creatinine 0.44 - 1.00 mg/dL 8.74  8.60  9.13   Sodium 135 - 145 mmol/L 140  141  140   Potassium 3.5 - 5.1 mmol/L 5.1  3.1  3.9   Chloride 98 - 111 mmol/L 106  100  108   CO2 22 - 32 mmol/L 26  23  25    Calcium  8.9 - 10.3 mg/dL 8.9  9.1  8.5     DG HIP UNILAT WITH PELVIS 2-3 VIEWS LEFT Result Date: 08/28/2024 CLINICAL DATA:  Elective surgery. EXAM: DG HIP (WITH OR WITHOUT PELVIS) 2-3V LEFT COMPARISON:  Radiograph earlier today FINDINGS: Six fluoroscopic spot views of the left hip submitted from the operating room. Femoral intramedullary nail with trans trochanteric and distal locking screw fixation  traverse proximal femur fracture. Fluoroscopy time 1 minutes 34 seconds. Dose 27.78 mGy. IMPRESSION: Intraoperative fluoroscopy during left proximal femur fracture ORIF. Electronically Signed   By: Andrea Gasman M.D.   On: 08/28/2024 17:06   DG C-Arm 1-60 Min-No Report Result Date: 08/28/2024 Fluoroscopy was utilized by the requesting physician.  No radiographic interpretation.   DG Chest 1 View Result Date: 08/28/2024 EXAM: 1 VIEW(S) XRAY OF THE CHEST 08/28/2024 11:13:00 AM COMPARISON: CTA chest 07/02/2024 and earlier. CLINICAL HISTORY: 78 year old female. Preop. Fall today. Hx of HTN. FINDINGS: LUNGS AND PLEURA: Left lung volume and ventilation appears substantially improved from the abnormal CTA in August. No confluent lung opacity now. No pulmonary edema. No pleural effusion. No pneumothorax. HEART AND MEDIASTINUM: No acute abnormality of the cardiac and mediastinal silhouettes. BONES AND SOFT TISSUES: No acute osseous abnormality. IMPRESSION: 1. No acute cardiopulmonary abnormality. Electronically signed by: Helayne Hurst MD 08/28/2024 11:36 AM EDT RP Workstation: [id number]   DG HIP UNILAT WITH PELVIS 2-3 VIEWS LEFT Result Date: 08/28/2024 EXAM: 2 or 3 VIEW(S) XRAY OF THE PELVIS AND LEFT HIP 08/28/2024 11:13:00 AM COMPARISON: CT abdomen and pelvis 07/19/2024. CLINICAL HISTORY: 78 year old female. Fall today at home, left hip pain. FINDINGS: JOINTS: SI joints are symmetric. Right hip demonstrates normal alignment. Left hip: Comminuted left femur intertrochanteric fracture varus impaction. Left femoral head appears to remain normally located. SOFT TISSUES: Negative lower abdominal and pelvic visceral contours. IMPRESSION: 1. Comminuted left intertrochanteric femur fracture with varus impaction. . Electronically signed by: Helayne Hurst MD 08/28/2024 11:35 AM EDT RP Workstation: [id number]   CT HEAD WO CONTRAST ( ) Result Date: 08/28/2024 CLINICAL DATA:  Status post fall. EXAM: CT HEAD WITHOUT  CONTRAST TECHNIQUE: Contiguous axial images were obtained from the base of the skull through the vertex without intravenous contrast. RADIATION DOSE REDUCTION: This exam was performed according to the departmental dose-optimization program which includes automated exposure control, adjustment of the mA and/or kV according to patient size and/or use of iterative reconstruction technique. COMPARISON:  October 29, 2023 FINDINGS: Brain: There is generalized cerebral atrophy with widening of the extra-axial spaces and ventricular dilatation. There are areas of decreased attenuation within the white matter tracts of the supratentorial brain, consistent with microvascular disease changes. Vascular: No hyperdense vessel or unexpected calcification. Skull: Normal. Negative for fracture or focal lesion. Sinuses/Orbits: No acute finding. Other: None. IMPRESSION: 1. No acute intracranial abnormality. 2. 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 Electronically Signed   By: Suzen Dials M.D.   On: 08/28/2024 11:10    Disposition Plan & Communication  Patient status: Inpatient  Admitted From: Home Planned disposition location: Skilled nursing facility Anticipated discharge date: 10/15 pending clinical improvement   Family Communication:  husband at bedside    Author: Marien LITTIE Piety, DO [facility] 08/29/2024, 7:53 AM   Available by Epic secure chat 7AM-7PM. If 7PM-7AM, please contact night-coverage.  [facility] contact information found on [email or web address].

## 2024-08-29 NOTE — Plan of Care (Signed)

## 2024-08-29 NOTE — Evaluation (Signed)
 Physical Therapy Evaluation Patient Details Name: [name] MRN: [id number] DOB: [birth year] Today's Date: 08/29/2024  History of Present Illness  The patient is a 78 year old female who presented to the [facility] emergency room after a fall and found to have a  left hip intertrochanteric fracture.  The patient was admitted by the medical team and optimized for surgery. Pt PO Left Hip Intramedullary nail.  Clinical Impression  Patient seen for initial PT evaluation due to decline in function. Patient is A&O x 4. Baseline mobility reported as modI with RW, currently requiring maxA for transfers needing several vc for postioning and physical assistance needed for lower extremity placement. Gait assessed with RW only able to take few steps in room with 1 hand held assist with use of gait belt, limited by pain which was monitored throughout session. Pt has 2 STE with left and right rails and can reach both. Pt lives with husband which per pt. Has limited ability to provide assistance. Clinical impression: patient presents with significant mobility limitations secondary to recent history of fall . Recommend skilled PT to address safety, mobility, and discharge planning.        If plan is discharge home, recommend the following: A lot of help with walking and/or transfers;A lot of help with bathing/dressing/bathroom   Can travel by private vehicle   No    Equipment Recommendations None recommended by PT  Recommendations for Other Services       Functional Status Assessment Patient has had a recent decline in their functional status and/or demonstrates limited ability to make significant improvements in function in a reasonable and predictable amount of time     Precautions / Restrictions Restrictions Weight Bearing Restrictions Per Provider Order: Yes LLE Weight Bearing Per Provider Order: Weight bearing as tolerated      Mobility  Bed Mobility Overal bed  mobility: Needs Assistance Bed Mobility: Rolling, Sidelying to Sit, Supine to Sit Rolling: Max assist Sidelying to sit: Max assist Supine to sit: Max assist     General bed mobility comments: required vc and physical assistance for lowe extremity and hand placement;    Transfers Overall transfer level: Needs assistance Equipment used: Rolling walker (2 wheels) Transfers: Sit to/from Stand, Bed to chair/wheelchair/BSC Sit to Stand: Max assist Stand pivot transfers: Max assist         General transfer comment: scoot EOB to optimize position for chair transfer    Ambulation/Gait Ambulation/Gait assistance: Total assist Gait Distance (Feet): 4 Feet (steps to chair) Assistive device: 1 person hand held assist Gait Pattern/deviations: Step-to pattern, Trunk flexed, Narrow base of support       General Gait Details: vc for foot placement and required continued verbal encouragement for LLE loading  Stairs            Wheelchair Mobility     Tilt Bed    Modified Rankin (Stroke Patients Only)       Balance Overall balance assessment: Needs assistance Sitting-balance support: Bilateral upper extremity supported, Feet supported Sitting balance-Leahy Scale: Poor       Standing balance-Leahy Scale: Zero                               Pertinent Vitals/Pain Pain Assessment Pain Assessment: (P) 0-10 Pain Score: (P) 4  Pain Location: (P) L hip Pain Descriptors / Indicators: (P) Dull Pain Intervention(s): (P) Limited activity within patient's tolerance, Monitored during session  Home Living Family/patient expects to be discharged to:: Private residence Living Arrangements: Spouse/significant other Available Help at Discharge: Family Type of Home: House Home Access: Stairs to enter Entrance Stairs-Rails: Can reach both;Left;Right Entrance Stairs-Number of Steps: 2   Home Layout: One level Home Equipment: Shower seat - built Manufacturing systems engineer (2 wheels)      Prior Function Prior Level of Function : Independent/Modified Independent             Mobility Comments: used RW       Extremity/Trunk Assessment        Lower Extremity Assessment Lower Extremity Assessment: Generalized weakness    Cervical / Trunk Assessment Cervical / Trunk Assessment: Normal  Communication   Communication Communication: No apparent difficulties    Cognition Arousal: Alert Behavior During Therapy: WFL for tasks assessed/performed   PT - Cognitive impairments: No apparent impairments                         Following commands: Intact       Cueing Cueing Techniques: Verbal cues     General Comments      Exercises     Assessment/Plan    PT Assessment Patient needs continued PT services  PT Problem List Decreased strength;Decreased range of motion;Decreased balance;Decreased mobility;Decreased coordination;Decreased safety awareness       PT Treatment Interventions DME instruction;Gait training;Stair training;Therapeutic activities;Therapeutic exercise;Balance training;Neuromuscular re-education;Patient/family education    PT Goals (Current goals can be found in the Care Plan section)  Acute Rehab PT Goals Patient Stated Goal: pt wants to go home and not open to going to SNF PT Goal Formulation: With patient Time For Goal Achievement: 09/12/24 Potential to Achieve Goals: Poor    Frequency Min 3X/week     Co-evaluation               AM-PAC PT 6 Clicks Mobility  Outcome Measure Help needed turning from your back to your side while in a flat bed without using bedrails?: A Little Help needed moving from lying on your back to sitting on the side of a flat bed without using bedrails?: A Little Help needed moving to and from a bed to a chair (including a wheelchair)?: A Lot Help needed standing up from a chair using your arms (e.g., wheelchair or bedside chair)?: A Lot Help needed to walk in  hospital room?: Total Help needed climbing 3-5 steps with a railing? : Total 6 Click Score: 12    End of Session Equipment Utilized During Treatment: Gait belt Activity Tolerance: Patient limited by pain;Other (comment) (Pt reported dizziness with postion changes which lingered throughout session) Patient left: in chair;with chair alarm set Nurse Communication: Mobility status PT Visit Diagnosis: Unsteadiness on feet (R26.81);Muscle weakness (generalized) (M62.81);Other symptoms and signs involving the nervous system (R29.898)    Time: 9149-9077 PT Time Calculation (min) (ACUTE ONLY): 32 min   Charges:   PT Evaluation $PT Eval Low Complexity: 1 Low   PT General Charges $$ ACUTE PT VISIT: 1 Visit         Sherlean Lesches DPT, PT    Simon Llamas A Danel Requena 08/29/2024, 10:10 AM

## 2024-08-29 NOTE — Evaluation (Signed)
 Occupational Therapy Evaluation Patient Details Name: [name] MRN: [id number] DOB: May 11, 1946 Today's Date: 08/29/2024   History of Present Illness   Pt is a 78 year old female s/p L IMN 08/29/24 after presenting to the ED with a fall     PMH significant for PE in 06/2024 on eliquis , hypothyroidism, MDD, GAD     Clinical Impressions Chart reviewed to date, pt greeted in chair, alert and oriented x4, anxious. She reports PTA she is MOD I-I in ADL/IADL, amb with rollator (which got away from her when she was feeling dizzy, resulting in her fall). She reports no dizziness at this time. Pt presents with deficits in strength, endurance, activity tolerance balance, affecting safe and optimal ADL completion. See further details below. Pt is performing ADL/functional mobility below PLOF, will benefit from acute OT to address deficits and to facilitate hospital ADL performance. OT will follow.      If plan is discharge home, recommend the following:   A lot of help with walking and/or transfers;A lot of help with bathing/dressing/bathroom     Functional Status Assessment   Patient has had a recent decline in their functional status and demonstrates the ability to make significant improvements in function in a reasonable and predictable amount of time.     Equipment Recommendations   BSC/3in1;Other (comment) (2WW)     Recommendations for Other Services         Precautions/Restrictions   Precautions Precautions: Fall Recall of Precautions/Restrictions: Impaired Restrictions Weight Bearing Restrictions Per Provider Order: Yes LLE Weight Bearing Per Provider Order: Weight bearing as tolerated     Mobility Bed Mobility               General bed mobility comments: NT in recliner pre/post session    Transfers Overall transfer level: Needs assistance Equipment used: Rolling walker (2 wheels) Transfers: Sit to/from Stand Sit to Stand: Min assist                   Balance Overall balance assessment: Needs assistance Sitting-balance support: Bilateral upper extremity supported, Feet supported Sitting balance-Leahy Scale: Fair     Standing balance support: Bilateral upper extremity supported, During functional activity, Reliant on assistive device for balance Standing balance-Leahy Scale: Poor                             ADL either performed or assessed with clinical judgement   ADL Overall ADL's : Needs assistance/impaired Eating/Feeding: Set up   Grooming: Set up;Sitting       Lower Body Bathing: Maximal assistance;Sit to/from stand   Upper Body Dressing : Minimal assistance;Sitting Upper Body Dressing Details (indicate cue type and reason): doff/donn gown Lower Body Dressing: Maximal assistance Lower Body Dressing Details (indicate cue type and reason): doff/donn socks Toilet Transfer: Minimal assistance Toilet Transfer Details (indicate cue type and reason): step pivot to and from bsc with RW; frequent vcs for technique Toileting- Clothing Manipulation and Hygiene: Maximal assistance;Sit to/from stand               Vision Patient Visual Report: No change from baseline;Diplopia (has baseline diplopia) Additional Comments: patching her eyes at baseline, currently ongoing work up she reports she has seen a neurologist     Perception         Praxis         Pertinent Vitals/Pain Pain Assessment Pain Assessment: Faces Faces Pain Scale: Hurts whole lot Pain  Location: L hip Pain Descriptors / Indicators: Discomfort, Sore Pain Intervention(s): Limited activity within patient's tolerance, Monitored during session, Repositioned     Extremity/Trunk Assessment Upper Extremity Assessment Upper Extremity Assessment: Overall WFL for tasks assessed   Lower Extremity Assessment Lower Extremity Assessment: Generalized weakness;LLE deficits/detail LLE Deficits / Details: s/p L IMN   Cervical / Trunk  Assessment Cervical / Trunk Assessment: Normal   Communication Communication Communication: No apparent difficulties   Cognition Arousal: Alert Behavior During Therapy: WFL for tasks assessed/performed, Anxious Cognition: Cognition impaired           Executive functioning impairment (select all impairments): Problem solving OT - Cognition Comments: will continue to assess                 Following commands: Intact       Cueing  General Comments   Cueing Techniques: Verbal cues  spo2 >90% on RA, HR 92 bpm after mobility attempts   Exercises Other Exercises Other Exercises: edu pt/husband re role of OT, role of rehab, discharge recommendations   Shoulder Instructions      Home Living Family/patient expects to be discharged to:: Private residence Living Arrangements: Spouse/significant other (son is visiting from [place]  right now) Available Help at Discharge: Family Type of Home: House Home Access: Stairs to enter Secretary/administrator of Steps: 2 Entrance Stairs-Rails: Can reach both;Left;Right Home Layout: One level     Bathroom Shower/Tub: Producer, television/film/video: Standard Bathroom Accessibility: Yes   Home Equipment: Shower seat - built in;BSC/3in1;Rollator (4 wheels)          Prior Functioning/Environment Prior Level of Function : Independent/Modified Independent;History of Falls (last six months)             Mobility Comments: amb with rollator ADLs Comments: MOD I-I in ADL/IADL per her report    OT Problem List: Decreased strength;Decreased activity tolerance;Impaired balance (sitting and/or standing);Decreased knowledge of use of DME or AE;Decreased safety awareness   OT Treatment/Interventions: Self-care/ADL training;Balance training;Therapeutic exercise;Therapeutic activities;Patient/family education;DME and/or AE instruction      OT Goals(Current goals can be found in the care plan section)   Acute Rehab OT  Goals Patient Stated Goal: go home OT Goal Formulation: With patient/family Time For Goal Achievement: 09/12/24 Potential to Achieve Goals: Fair ADL Goals Pt Will Perform Grooming: with modified independence;sitting;standing Pt Will Perform Lower Body Dressing: with modified independence;sitting/lateral leans;sit to/from stand Pt Will Transfer to Toilet: with modified independence;ambulating Pt Will Perform Toileting - Clothing Manipulation and hygiene: with modified independence;sitting/lateral leans;sit to/from stand   OT Frequency:  Min 3X/week    Co-evaluation              AM-PAC OT 6 Clicks Daily Activity     Outcome Measure Help from another person eating meals?: None Help from another person taking care of personal grooming?: None Help from another person toileting, which includes using toliet, bedpan, or urinal?: A Lot Help from another person bathing (including washing, rinsing, drying)?: A Lot Help from another person to put on and taking off regular upper body clothing?: A Little Help from another person to put on and taking off regular lower body clothing?: A Lot 6 Click Score: 17   End of Session Equipment Utilized During Treatment: Rolling walker (2 wheels) Nurse Communication: Mobility status  Activity Tolerance: Patient tolerated treatment well Patient left: in chair;with call bell/phone within reach;with chair alarm set;with family/visitor present  OT Visit Diagnosis: Other abnormalities of gait and mobility (R26.89);Muscle  weakness (generalized) (M62.81)                Time: 9079-9044 OT Time Calculation (min): 35 min Charges:  OT General Charges $OT Visit: 1 Visit OT Evaluation $OT Eval Moderate Complexity: 1 Mod  Therisa Sheffield, OTD OTR/L  08/29/24, 11:14 AM

## 2024-08-29 NOTE — Plan of Care (Signed)

## 2024-08-30 ENCOUNTER — Inpatient Hospital Stay

## 2024-08-30 DIAGNOSIS — S72143A Displaced intertrochanteric fracture of unspecified femur, initial encounter for closed fracture: Secondary | ICD-10-CM | POA: Diagnosis not present

## 2024-08-30 LAB — CBC
HCT: 32.9 % — ABNORMAL LOW (ref 36.0–46.0)
Hemoglobin: 11 g/dL — ABNORMAL LOW (ref 12.0–15.0)
MCH: 29.5 pg (ref 26.0–34.0)
MCHC: 33.4 g/dL (ref 30.0–36.0)
MCV: 88.2 fL (ref 80.0–100.0)
Platelets: 189 K/uL (ref 150–400)
RBC: 3.73 MIL/uL — ABNORMAL LOW (ref 3.87–5.11)
RDW: 13.2 % (ref 11.5–15.5)
WBC: 12.2 K/uL — ABNORMAL HIGH (ref 4.0–10.5)
nRBC: 0 % (ref 0.0–0.2)

## 2024-08-30 LAB — BASIC METABOLIC PANEL WITH GFR
Anion gap: 7 (ref 5–15)
BUN: 39 mg/dL — ABNORMAL HIGH (ref 8–23)
CO2: 25 mmol/L (ref 22–32)
Calcium: 8.4 mg/dL — ABNORMAL LOW (ref 8.9–10.3)
Chloride: 108 mmol/L (ref 98–111)
Creatinine, Ser: 1.2 mg/dL — ABNORMAL HIGH (ref 0.44–1.00)
GFR, Estimated: 47 mL/min — ABNORMAL LOW (ref >60)
Glucose, Bld: 120 mg/dL — ABNORMAL HIGH (ref 70–99)
Potassium: 4.1 mmol/L (ref 3.5–5.1)
Sodium: 140 mmol/L (ref 135–145)

## 2024-08-30 NOTE — [place] FL2 (Signed)
 [place]  MEDICAID FL2 LEVEL OF CARE FORM     IDENTIFICATION  Patient Name: [name] Birthdate: [birth year] Sex: female Admission Date (Current Location): 08/28/2024  [facility] and [place] Number:  Chiropodist and Address:  [facility], [address], [place], [place] [address]      Provider Number: [id number]  Attending Physician Name and Address:  Marsa Edelman, DO  Relative Name and Phone Number:       Current Level of Care: SNF Recommended Level of Care: Skilled Nursing Facility Prior Approval Number:    Date Approved/Denied:   PASRR Number: [id number]  Discharge Plan: SNF    Current Diagnoses: Patient Active Problem List   Diagnosis Date Noted   Closed comminuted intertrochanteric fracture of femur, initial encounter (HCC) 08/28/2024   DVT of deep femoral vein, right (HCC) 07/04/2024   Acute pulmonary embolism with acute cor pulmonale (HCC) 07/03/2024   Pleural effusion 07/03/2024   Chronic systolic CHF (congestive heart failure) (HCC) 07/02/2024   Nonischemic cardiomyopathy (HCC) 07/02/2024   CAP (community acquired pneumonia) 07/01/2024   Plaque psoriasis 09/28/2018   Acquired hypothyroidism 01/15/2017   Benign essential hypertension 10/13/2016    Orientation RESPIRATION BLADDER Height & Weight     Self, Time, Situation, Place  Normal Continent Weight: 185 lb (83.9 kg) Height:  5' 6 (167.6 cm)  BEHAVIORAL SYMPTOMS/MOOD NEUROLOGICAL BOWEL NUTRITION STATUS      Continent Diet  AMBULATORY STATUS COMMUNICATION OF NEEDS Skin   Limited Assist Verbally Surgical wounds (Surgical Closed Incision Right Hip)                       Personal Care Assistance Level of Assistance  Bathing, Feeding, Dressing Bathing Assistance: Limited assistance Feeding assistance: Independent Dressing Assistance: Limited assistance     Functional Limitations Info             SPECIAL CARE FACTORS FREQUENCY  PT (By licensed  PT), OT (By licensed OT)     PT Frequency: 5x/week OT Frequency: 5x/week            Contractures      Additional Factors Info  Code Status, Allergies Code Status Info: Full Allergies Info: Ciprofloxacin           Current Medications (08/30/2024):  This is the current hospital active medication list Current Facility-Administered Medications  Medication Dose Route Frequency Provider Last Rate Last Admin   acetaminophen  (TYLENOL ) tablet 325-650 mg  325-650 mg Oral Q6H PRN Aberman, Zachary, MD       apixaban  (ELIQUIS ) tablet 5 mg  5 mg Oral BID Aberman, Zachary, MD   5 mg at 08/29/24 2232   atorvastatin  (LIPITOR) tablet 10 mg  10 mg Oral Daily Khan, Ghalib, MD   10 mg at 08/29/24 0802   escitalopram  (LEXAPRO ) tablet 10 mg  10 mg Oral Daily Khan, Ghalib, MD   10 mg at 08/29/24 0802   folic acid (FOLVITE) tablet 1 mg  1 mg Oral Daily Khan, Ghalib, MD   1 mg at 08/29/24 0802   HYDROcodone-acetaminophen  (NORCO) 7.5-325 MG per tablet 1-2 tablet  1-2 tablet Oral Q4H PRN Aberman, Zachary, MD   2 tablet at 08/29/24 1748   HYDROcodone-acetaminophen  (NORCO/VICODIN) 5-325 MG per tablet 1-2 tablet  1-2 tablet Oral Q4H PRN Aberman, Zachary, MD   2 tablet at 08/29/24 2232   levothyroxine  (SYNTHROID ) tablet 150 mcg  150 mcg Oral Q0600 Khan, Ghalib, MD   150 mcg at 08/30/24  9380   melatonin tablet 2.5 mg  2.5 mg Oral QHS Fernand Prost, MD   2.5 mg at 08/29/24 2232   menthol (CEPACOL) lozenge 3 mg  1 lozenge Oral PRN Aberman, Zachary, MD       Or   phenol (CHLORASEPTIC) mouth spray 1 spray  1 spray Mouth/Throat PRN Aberman, Zachary, MD       metoCLOPramide (REGLAN) tablet 5-10 mg  5-10 mg Oral Q8H PRN Aberman, Zachary, MD       Or   metoCLOPramide (REGLAN) injection 5-10 mg  5-10 mg Intravenous Q8H PRN Aberman, Zachary, MD       morphine  (PF) 2 MG/ML injection 0.5-1 mg  0.5-1 mg Intravenous Q2H PRN Aberman, Zachary, MD       ondansetron  (ZOFRAN ) tablet 4 mg  4 mg Oral Q6H PRN Aberman, Zachary, MD        Or   ondansetron  (ZOFRAN ) injection 4 mg  4 mg Intravenous Q6H PRN Aberman, Zachary, MD       polyethylene glycol (MIRALAX  / GLYCOLAX ) packet 17 g  17 g Oral BID Anderson, Chelsey L, MD       traZODone (DESYREL) tablet 50 mg  50 mg Oral QHS Khan, Ghalib, MD   50 mg at 08/29/24 2232     Discharge Medications: Please see discharge summary for a list of discharge medications.  Relevant Imaging Results:  Relevant Lab Results:   Additional Information SSN: [id number]  Alvaro Louder, LCSW

## 2024-08-30 NOTE — Progress Notes (Signed)
 Occupational Therapy Treatment Patient Details Name: [name] MRN: [id number] DOB: 01-20-1946 Today's Date: 08/30/2024   History of present illness Pt is a 78 year old female s/p L IMN 08/29/24 after presenting to the ED with a fall     PMH significant for PE in 06/2024 on eliquis , hypothyroidism, MDD, GAD   OT comments  Patient seen for OT treatment on this date. Upon arrival to room patient was in bed returning from US , negative for DVT, agreeable to treatment. Patient performed bed mobility with mod A to move LLE and to provide min A to lift trunk; from EOB patient performed sit<>stand with r/w with min A; patient ambulated ~5-10 feet to bathroom to perform toileting/toilet transfer/grooming and LB dressing. Patient performed transfer with [facility] set up over toilet with CGA, patient able to don underpants with cues for technique with SBA. Patient performed thorough pericare while standing with CGA. Patient performed grooming with CGA while standing at sink; patient then ambulated from bathroom to recliner (about 15 feet) with r/w with cga/min A. Patient transferred to recliner with min A..  Patient ended treatment in chair with bed alarm on and all needs within reach. Patient making good progress toward goals, will continue to follow POC. Discharge recommendation remains appropriate.        If plan is discharge home, recommend the following:  A lot of help with walking and/or transfers;A lot of help with bathing/dressing/bathroom   Equipment Recommendations  BSC/3in1;Other (comment)    Recommendations for Other Services      Precautions / Restrictions Precautions Precautions: Fall Recall of Precautions/Restrictions: Impaired Restrictions Weight Bearing Restrictions Per Provider Order: Yes LLE Weight Bearing Per Provider Order: Weight bearing as tolerated       Mobility Bed Mobility Overal bed mobility: Needs Assistance Bed Mobility: Supine to Sit, Rolling Rolling: Min assist    Supine to sit: Mod assist          Transfers Overall transfer level: Needs assistance Equipment used: Rolling walker (2 wheels) Transfers: Sit to/from Stand Sit to Stand: Min assist, Contact guard assist                 Balance Overall balance assessment: Needs assistance Sitting-balance support: Bilateral upper extremity supported, Feet supported Sitting balance-Leahy Scale: Good     Standing balance support: Bilateral upper extremity supported, During functional activity, Reliant on assistive device for balance Standing balance-Leahy Scale: Fair                             ADL either performed or assessed with clinical judgement   ADL       Grooming: Wash/dry hands;Contact guard assist;Standing       Lower Body Bathing: Contact guard assist;Sit to/from stand       Lower Body Dressing: Contact guard assist;Sit to/from stand   Toilet Transfer: Contact guard assist;Minimal assistance;Comfort height toilet;Grab bars;Rolling walker (2 wheels)   Toileting- Clothing Manipulation and Hygiene: Minimal assistance;Sit to/from stand              Extremity/Trunk Assessment Upper Extremity Assessment Upper Extremity Assessment: Overall WFL for tasks assessed            Vision       Perception     Praxis     Communication Communication Communication: No apparent difficulties   Cognition Arousal: Alert Behavior During Therapy: WFL for tasks assessed/performed, Anxious Cognition: No apparent impairments  Following commands: Intact        Cueing   Cueing Techniques: Verbal cues  Exercises      Shoulder Instructions       General Comments      Pertinent Vitals/ Pain       Pain Assessment Pain Assessment: 0-10 Pain Score: 5  Pain Location: L hip Pain Descriptors / Indicators: Discomfort, Sore Pain Intervention(s): Monitored during session  Home Living                                           Prior Functioning/Environment              Frequency  Min 3X/week        Progress Toward Goals  OT Goals(current goals can now be found in the care plan section)  Progress towards OT goals: Progressing toward goals  Acute Rehab OT Goals Patient Stated Goal: go home OT Goal Formulation: With patient Time For Goal Achievement: 09/12/24 Potential to Achieve Goals: Good ADL Goals Pt Will Perform Grooming: with modified independence;sitting;standing Pt Will Perform Lower Body Dressing: with modified independence;sitting/lateral leans;sit to/from stand Pt Will Transfer to Toilet: with modified independence;ambulating Pt Will Perform Toileting - Clothing Manipulation and hygiene: with modified independence;sitting/lateral leans;sit to/from stand  Plan      Co-evaluation                 AM-PAC OT 6 Clicks Daily Activity     Outcome Measure   Help from another person eating meals?: None Help from another person taking care of personal grooming?: None Help from another person toileting, which includes using toliet, bedpan, or urinal?: A Little Help from another person bathing (including washing, rinsing, drying)?: A Little Help from another person to put on and taking off regular upper body clothing?: A Little Help from another person to put on and taking off regular lower body clothing?: A Little 6 Click Score: 20    End of Session Equipment Utilized During Treatment: Rolling walker (2 wheels);Gait belt  OT Visit Diagnosis: Other abnormalities of gait and mobility (R26.89);Muscle weakness (generalized) (M62.81)   Activity Tolerance Patient tolerated treatment well   Patient Left in chair;with call bell/phone within reach;with chair alarm set   Nurse Communication          Time: 8940-8874 OT Time Calculation (min): 26 min  Charges: OT General Charges $OT Visit: 1 Visit OT Treatments $Self Care/Home Management : 23-37  mins  Rogers Clause, OT/L MSOT, 08/30/2024

## 2024-08-30 NOTE — Hospital Course (Addendum)
 Hospital course / significant events:   HPI: [name] is a 78 y.o. female with a PMH significant for PE in 06/2024 on eliquis , hypothyroidism, MDD, GAD presenting to the ED after a fall. Pt states felt dizzy and started sweating in her head and was to walk to get to her husband and fell prior to reaching him. Pt states she was not eating well for a couple of days prior as she wasn't hungry.    10/13: admitted w/ left intertrochanteric femur fracture. Lab work showed an AKI along with hypokalemia and CBC with mild leukocytosis. To OR today for L hip intramedullary nail repair.  10/14: PT/OT rec for SNF rehab  10/15: auth for rehab ok to go tomorrow. Of note concern for LE edema, no DVT on US , quetsion min nonocclusive thrombus R peroneal V 10/16:     Consultants:  Orthopedics surgery   Procedures/Surgeries: 10/13: L hip intramedullary nail repair.       ASSESSMENT & PLAN:   Right intertrochanteric femur fracture s/p L hip intramedullary nail repair.10/13 continue PT/OT analgesia PRN.  ortho following Resume Eliquis     AKI- resolved  Baseline around 0.8. BMP outpatient in 1-2 weeks    Chronic conditions stable: Acquired hypothyroidism: continue home synthroid .  HTN: held home beta blocker, hydrochlorothiazide - BP has been at goal  Hx HFpEF last echo 06/2024 EF 55-60 grde 1 diast df: held home beta blocker d/t low BP, follow outpatient to resume or continue holding  Hx of DVT/PE: restarted eliquis      Overweight / Class 1 obesity based on BMI: Body mass index is 29.86 kg/m.SABRA Significantly low or high BMI is associated with higher medical risk.  Underweight - under 18  overweight - 25 to 29 obese - 30 or more Class 1 obesity: BMI of 30.0 to 34 Class 2 obesity: BMI of 35.0 to 39 Class 3 obesity: BMI of 40.0 to 49 Super Morbid Obesity: BMI 50-59 Super-super Morbid Obesity: BMI 60+ Healthy nutrition and physical activity advised as adjunct to other disease  management and risk reduction treatments    DVT prophylaxis: eliquis  IV fluids: no continuous IV fluids  Nutrition: cardiac Central lines / other devices: none  Code Status: FULL CODE ACP documentation reviewed:  none on file in [facility]  [facility] needs: SNF rehab Medical barriers to dispo: none. Expected readiness for discharge pending SNF acceptance --> can go tomorrow

## 2024-08-30 NOTE — Progress Notes (Signed)
   Subjective: 2 Days Post-Op Procedure(s) (LRB): FIXATION, FRACTURE, INTERTROCHANTERIC, WITH INTRAMEDULLARY ROD (Left) Patient reports pain as mild.   Patient is well, and has had no acute complaints or problems Denies any CP, SOB, ABD pain. We will continue therapy today.    Objective: Vital signs in last 24 hours: Temp:  [98.2 F (36.8 C)-98.6 F (37 C)] 98.2 F (36.8 C) (10/15 0726) Pulse Rate:  [79-95] 80 (10/15 0726) Resp:  [17-18] 18 (10/15 0726) BP: (94-152)/(61-74) 94/74 (10/15 0726) SpO2:  [91 %-93 %] 93 % (10/15 0726)  Intake/Output from previous day: 10/14 0701 - 10/15 0700 In: 480 [P.O.:480] Out: -  Intake/Output this shift: No intake/output data recorded.  Recent Labs    08/28/24 1045 08/29/24 0541 08/30/24 0319  HGB 14.7 13.2 11.0*   Recent Labs    08/29/24 0541 08/30/24 0319  WBC 13.3* 12.2*  RBC 4.54 3.73*  HCT 39.8 32.9*  PLT 226 189   Recent Labs    08/29/24 0541 08/30/24 0319  NA 140 140  K 5.1 4.1  CL 106 108  CO2 26 25  BUN 34* 39*  CREATININE 1.25* 1.20*  GLUCOSE 151* 120*  CALCIUM  8.9 8.4*   Recent Labs    08/28/24 1454  INR 1.0    EXAM General - Patient is Alert, Appropriate, and Oriented Extremity - Neurovascular intact Sensation intact distally Intact pulses distally Dorsiflexion/Plantar flexion intact No cellulitis present Compartment soft Dressing - dressing C/D/I and no drainage Motor Function - intact, moving foot and toes well on exam.   Past Medical History:  Diagnosis Date   Arthritis    C. difficile diarrhea    Hypertension    Thyroid  disease     Assessment/Plan:   2 Days Post-Op Procedure(s) (LRB): FIXATION, FRACTURE, INTERTROCHANTERIC, WITH INTRAMEDULLARY ROD (Left) Principal Problem:   Closed comminuted intertrochanteric fracture of femur, initial encounter (HCC) Active Problems:   Acquired hypothyroidism   Benign essential hypertension   Chronic systolic CHF (congestive heart failure)  (HCC)   Nonischemic cardiomyopathy (HCC)  Estimated body mass index is 29.86 kg/m as calculated from the following:   Height as of this encounter: 5' 6 (1.676 m).   Weight as of this encounter: 83.9 kg. Advance diet Up with therapy, slow progress with PT Pain well controlled Labs and VSS Resume Eliquis , will order BLE Venous US  Today CM to assist with discharge  DVT Prophylaxis - TED hose and SCDS Eliquis  Weight-Bearing as tolerated to left leg   T. Medford Amber, PA-C [facility] 08/30/2024, 8:03 AM

## 2024-08-30 NOTE — Progress Notes (Signed)
 Physical Therapy Treatment Patient Details Name: [name] MRN: [id number] DOB: 1946-02-21 Today's Date: 08/30/2024   History of Present Illness Pt is a 78 year old female s/p L IMN 08/29/24 after presenting to the ED with a fall     PMH significant for PE in 06/2024 on eliquis , hypothyroidism, MDD, GAD    PT Comments  Pt was long sitting in recliner upon arrival. She is A and O x 4 and agreeable to session. BP 130/74 at rest however dropped to 92/64 in standing. Pt does have dizziness and LOB with minimal gait/ambulation. Pt continues to endorse wanting to Dc directly home form acute admission. Author encouraged pt to reconsider. Pt remains at high risk of falls and will benefit from the continued skilled PT at DC to maximize her independence and safety while returning to PLOF.    If plan is discharge home, recommend the following: A lot of help with walking and/or transfers;A lot of help with bathing/dressing/bathroom     Equipment Recommendations  None recommended by PT       Precautions / Restrictions Precautions Precautions: Fall Recall of Precautions/Restrictions: Intact Restrictions Weight Bearing Restrictions Per Provider Order: Yes LLE Weight Bearing Per Provider Order: Weight bearing as tolerated     Mobility  Bed Mobility Bed Mobility: Sit to Supine  Sit to supine: Min assist, Mod assist General bed mobility comments: min-mod assist to progress BLEs into bed form EOB sitting. Pt needed to returnt o bed 2/2 to transport taking her to US     Transfers Overall transfer level: Needs assistance Equipment used: Rolling walker (2 wheels) Transfers: Sit to/from Stand Sit to Stand: Min assist  General transfer comment: BP while long sitting 130/74. Standing 92/64 with pt c/o dizziness that resolves with time. pt remains sverely unsteady and at high risk of falls.    Ambulation/Gait Ambulation/Gait assistance: Min assist, Mod assist Gait Distance (Feet): 10  Feet Assistive device: Rolling walker (2 wheels) Gait Pattern/deviations: Step-to pattern, Narrow base of support Gait velocity: decreased  General Gait Details: Pt ambulated ~ 10 ft total with 2 episodes of LOB with intervetion to prevent fall. Pt remains at high risk of falls and is encouraged to use RW and have assistance with any/all standing activity   Balance Overall balance assessment: Needs assistance Sitting-balance support: Feet supported Sitting balance-Leahy Scale: Fair     Standing balance support: Bilateral upper extremity supported, During functional activity, Reliant on assistive device for balance Standing balance-Leahy Scale: Poor Standing balance comment: pt is at high risk of falls     Communication Communication Communication: No apparent difficulties  Cognition Arousal: Alert Behavior During Therapy: WFL for tasks assessed/performed, Anxious   PT - Cognitive impairments: No apparent impairments    PT - Cognition Comments: Pt was seated in recliner upon arrival. She is alert and O x 4.  I was able to walk a little earlier. I walked around the bed to recliner. Following commands: Intact      Cueing Cueing Techniques: Verbal cues, Tactile cues         Pertinent Vitals/Pain Pain Assessment Pain Assessment: 0-10 Pain Score: 4  Pain Location: L hip Pain Descriptors / Indicators: Discomfort, Sore Pain Intervention(s): Limited activity within patient's tolerance, Monitored during session, Premedicated before session, Repositioned     PT Goals (current goals can now be found in the care plan section) Acute Rehab PT Goals Patient Stated Goal: wants home but is becoming more realistic in recommendation for STR at DC  Progress towards PT goals: Progressing toward goals    Frequency    7X/week       AM-PAC PT 6 Clicks Mobility   Outcome Measure  Help needed turning from your back to your side while in a flat bed without using bedrails?: A  Little Help needed moving from lying on your back to sitting on the side of a flat bed without using bedrails?: A Little Help needed moving to and from a bed to a chair (including a wheelchair)?: A Lot Help needed standing up from a chair using your arms (e.g., wheelchair or bedside chair)?: A Lot Help needed to walk in hospital room?: A Lot Help needed climbing 3-5 steps with a railing? : A Lot 6 Click Score: 14    End of Session   Activity Tolerance: Patient tolerated treatment well;Treatment limited secondary to medical complications (Comment) (continues to have dizziness/ drop in BP with change in positions) Patient left: in chair;with chair alarm set Nurse Communication: Mobility status PT Visit Diagnosis: Unsteadiness on feet (R26.81);Muscle weakness (generalized) (M62.81);Other symptoms and signs involving the nervous system (R29.898)     Time: 9069-9042 PT Time Calculation (min) (ACUTE ONLY): 27 min  Charges:    $Gait Training: 8-22 mins $Therapeutic Activity: 8-22 mins PT General Charges $$ ACUTE PT VISIT: 1 Visit                     Rankin Essex PTA 08/30/24, 1:42 PM

## 2024-08-30 NOTE — Progress Notes (Signed)
 Discussed ACP with patient. Patient will complete form and page Chaplain whenever completed. Delightful time of sharing and reflecting.   Goble Fudala Chaplain Intern

## 2024-08-30 NOTE — TOC Progression Note (Signed)
 Transition of Care [facility]) - Progression Note    Patient Details  Name: [name] MRN: [id number] Date of Birth: Apr 05, 1946  [facility] CM/SW Contact  Alvaro Louder, [place] Phone Number: 08/30/2024, 3:09 PM  Clinical Narrative:   [name] pending for patient to admit to [facility].Pending AuthID: [id number]   TOC to follow for discharge                      Expected Discharge Plan and Services                                               Social Drivers of Health (SDOH) Interventions SDOH Screenings   Food Insecurity: No Food Insecurity (08/28/2024)  Housing: Low Risk  (08/28/2024)  Recent Concern: Housing - High Risk (08/21/2024)   Received from [facility]  Transportation Needs: No Transportation Needs (08/28/2024)  Recent Concern: Transportation Needs - Unmet Transportation Needs (08/21/2024)   Received from [facility]  Utilities: Not At Risk (08/28/2024)  Financial Resource Strain: Low Risk  (08/21/2024)   Received from [facility]  Social Connections: Moderately Isolated (08/28/2024)  Tobacco Use: High Risk (08/28/2024)    Readmission Risk Interventions     No data to display

## 2024-08-30 NOTE — Plan of Care (Signed)
   Problem: Education: Goal: Knowledge of General Education information will improve Description Including pain rating scale, medication(s)/side effects and non-pharmacologic comfort measures Outcome: Progressing   Problem: Health Behavior/Discharge Planning: Goal: Ability to manage health-related needs will improve Outcome: Progressing

## 2024-08-30 NOTE — Progress Notes (Signed)
 Patient not present, will alert on call chaplain to visit with patient.    Madiha Bambrick Chaplain Intern

## 2024-08-30 NOTE — Progress Notes (Signed)
 PROGRESS NOTE    [name]   FMW:[id number] DOB: 25-Apr-1946  DOA: 08/28/2024 Date of Service: 08/30/24 which is hospital day 2  PCP: Cleotilde Oneil FALCON, MD    Hospital course / significant events:   HPI: [name] is a 78 y.o. female with a PMH significant for PE in 06/2024 on eliquis , hypothyroidism, MDD, GAD presenting to the ED after a fall. Pt states felt dizzy and started sweating in her head and was to walk to get to her husband and fell prior to reaching him. Pt states she was not eating well for a couple of days prior as she wasn't hungry.    10/13: admitted w/ left intertrochanteric femur fracture. Lab work showed an AKI along with hypokalemia and CBC with mild leukocytosis. To OR today for L hip intramedullary nail repair.  10/14: PT/OT rec for SNF rehab  10/15: auth for rehab ok to go tomorrow. Of note concern for LE edema, no DVT on US , quetsion min nonocclusive thrombus R peroneal V    Consultants:  Orthopedics surgery   Procedures/Surgeries: 10/13: L hip intramedullary nail repair.       ASSESSMENT & PLAN:   Right intertrochanteric femur fracture s/p L hip intramedullary nail repair.10/13 continue PT/OT analgesia PRN.  ortho following Resume Eliquis     AKI- improving  Baseline around 0.8. BMP am Encourage po hydration   Chronic conditions stable: Acquired hypothyroidism: continue home synthroid .  HTN: continue home beta blocker, hold HCTZ CHF: continue home beta blocker Hx of DVT/PE: restarted eliquis      Overweight / Class 1 obesity based on BMI: Body mass index is 29.86 kg/m.SABRA Significantly low or high BMI is associated with higher medical risk.  Underweight - under 18  overweight - 25 to 29 obese - 30 or more Class 1 obesity: BMI of 30.0 to 34 Class 2 obesity: BMI of 35.0 to 39 Class 3 obesity: BMI of 40.0 to 49 Super Morbid Obesity: BMI 50-59 Super-super Morbid Obesity: BMI 60+ Healthy nutrition and physical activity advised as  adjunct to other disease management and risk reduction treatments    DVT prophylaxis: eliquis  IV fluids: no continuous IV fluids  Nutrition: cardiac Central lines / other devices: none  Code Status: FULL CODE ACP documentation reviewed:  none on file in [facility]  [facility] needs: SNF rehab Medical barriers to dispo: none. Expected readiness for discharge pending SNF acceptance --> can go tomorrow              Subjective / Brief ROS:  Patient reports feeling okay today, no concerns  Denies CP/SOB.  Pain controlled.  Denies new weakness.  Tolerating diet.  Reports no concerns w/ urination/defecation.   Family Communication: none at this time     Objective Findings:  Vitals:   08/29/24 1935 08/30/24 0227 08/30/24 0726 08/30/24 1553  BP: 116/63 116/61 94/74 117/60  Pulse: 95 79 80 91  Resp: 18 18 18 18   Temp: 98.2 F (36.8 C) 98.6 F (37 C) 98.2 F (36.8 C) 98.3 F (36.8 C)  TempSrc:    Oral  SpO2: 91% 91% 93% 91%  Weight:      Height:        Intake/Output Summary (Last 24 hours) at 08/30/2024 1703 Last data filed at 08/30/2024 1300 Gross per 24 hour  Intake 460 ml  Output --  Net 460 ml   Filed Weights   08/28/24 1021  Weight: 83.9 kg    Examination:  Physical Exam Constitutional:  General: She is not in acute distress. Cardiovascular:     Rate and Rhythm: Normal rate and regular rhythm.  Pulmonary:     Effort: Pulmonary effort is normal.     Breath sounds: Normal breath sounds.  Abdominal:     General: Bowel sounds are normal.     Palpations: Abdomen is soft.  Musculoskeletal:     Right lower leg: No edema.     Left lower leg: No edema.  Skin:    General: Skin is warm and dry.  Neurological:     Mental Status: She is alert.          Scheduled Medications:   apixaban   5 mg Oral BID   atorvastatin   10 mg Oral Daily   escitalopram   10 mg Oral Daily   folic acid  1 mg Oral Daily   levothyroxine   150 mcg Oral Q0600   melatonin   2.5 mg Oral QHS   polyethylene glycol  17 g Oral BID   traZODone  50 mg Oral QHS    Continuous Infusions:   PRN Medications:  acetaminophen , HYDROcodone-acetaminophen , HYDROcodone-acetaminophen , menthol **OR** phenol, metoCLOPramide **OR** metoCLOPramide (REGLAN) injection, morphine  injection, ondansetron  **OR** ondansetron  (ZOFRAN ) IV  Antimicrobials from admission:  Anti-infectives (From admission, onward)    Start     Dose/Rate Route Frequency Ordered Stop   08/28/24 2130  ceFAZolin  (ANCEF ) IVPB 2g/100 mL premix        2 g 200 mL/hr over 30 Minutes Intravenous Every 6 hours 08/28/24 1827 08/29/24 0338   08/28/24 1330  ceFAZolin  (ANCEF ) IVPB 2g/100 mL premix        2 g 200 mL/hr over 30 Minutes Intravenous  Once 08/28/24 1323 08/28/24 1539           Data Reviewed:  I have personally reviewed the following...  CBC: Recent Labs  Lab 08/28/24 1045 08/29/24 0541 08/30/24 0319  WBC 13.6* 13.3* 12.2*  HGB 14.7 13.2 11.0*  HCT 45.7 39.8 32.9*  MCV 89.3 87.7 88.2  PLT 264 226 189   Basic Metabolic Panel: Recent Labs  Lab 08/28/24 1045 08/28/24 1454 08/29/24 0541 08/30/24 0319  NA 141  --  140 140  K 3.1*  --  5.1 4.1  CL 100  --  106 108  CO2 23  --  26 25  GLUCOSE 131*  --  151* 120*  BUN 34*  --  34* 39*  CREATININE 1.39*  --  1.25* 1.20*  CALCIUM  9.1  --  8.9 8.4*  MG  --  2.4  --   --    GFR: Estimated Creatinine Clearance: 43.5 mL/min (A) (by C-G formula based on SCr of 1.2 mg/dL (H)). Liver Function Tests: No results for input(s): AST, ALT, ALKPHOS, BILITOT, PROT, ALBUMIN in the last 168 hours. No results for input(s): LIPASE, AMYLASE in the last 168 hours. No results for input(s): AMMONIA in the last 168 hours. Coagulation Profile: Recent Labs  Lab 08/28/24 1454  INR 1.0   Cardiac Enzymes: No results for input(s): CKTOTAL, CKMB, CKMBINDEX, TROPONINI in the last 168 hours. BNP (last 3 results) No results for  input(s): PROBNP in the last 8760 hours. HbA1C: No results for input(s): HGBA1C in the last 72 hours. CBG: No results for input(s): GLUCAP in the last 168 hours. Lipid Profile: No results for input(s): CHOL, HDL, LDLCALC, TRIG, CHOLHDL, LDLDIRECT in the last 72 hours. Thyroid  Function Tests: No results for input(s): TSH, T4TOTAL, FREET4, T3FREE, THYROIDAB in the last 72 hours.  Anemia Panel: No results for input(s): VITAMINB12, FOLATE, FERRITIN, TIBC, IRON, RETICCTPCT in the last 72 hours. Most Recent Urinalysis On File:     Component Value Date/Time   COLORURINE Yellow 07/16/2013 1121   APPEARANCEUR Hazy 07/16/2013 1121   LABSPEC 1.025 07/16/2013 1121   PHURINE 5.0 07/16/2013 1121   GLUCOSEU Negative 07/16/2013 1121   HGBUR Negative 07/16/2013 1121   BILIRUBINUR Negative 07/16/2013 1121   KETONESUR 2+ 07/16/2013 1121   PROTEINUR 30 mg/dL 91/68/7985 8878   NITRITE Negative 07/16/2013 1121   LEUKOCYTESUR Trace 07/16/2013 1121   Sepsis Labs: @LABRCNTIP (procalcitonin:4,lacticidven:4) Microbiology: No results found for this or any previous visit (from the past 240 hours).    Radiology Studies last 3 days: US  Venous Img Lower Bilateral (DVT) Result Date: 08/30/2024 CLINICAL DATA:  Fall with fracture.  Assess for DVT. EXAM: BILATERAL LOWER EXTREMITY VENOUS DOPPLER ULTRASOUND TECHNIQUE: Gray-scale sonography with graded compression, as well as color Doppler and duplex ultrasound were performed to evaluate the lower extremity deep venous systems from the level of the common femoral vein and including the common femoral, femoral, profunda femoral, popliteal and calf veins including the posterior tibial, peroneal and gastrocnemius veins when visible. The superficial great saphenous vein was also interrogated. Spectral Doppler was utilized to evaluate flow at rest and with distal augmentation maneuvers in the common femoral, femoral and popliteal veins.  COMPARISON:  None Available. FINDINGS: RIGHT LOWER EXTREMITY Common Femoral Vein: No evidence of thrombus. Normal compressibility, respiratory phasicity and response to augmentation. Saphenofemoral Junction: No evidence of thrombus. Normal compressibility and flow on color Doppler imaging. Profunda Femoral Vein: No evidence of thrombus. Normal compressibility and flow on color Doppler imaging. Femoral Vein: No evidence of thrombus. Normal compressibility, respiratory phasicity and response to augmentation. Popliteal Vein: No evidence of thrombus. Normal compressibility, respiratory phasicity and response to augmentation. Calf Veins: No evidence of thrombus. Normal compressibility and flow on color Doppler imaging. Question minimal sequelae of old DVT within the peroneal veins. Superficial Great Saphenous Vein: No evidence of thrombus. Normal compressibility. Venous Reflux:  None. Other Findings:  None. LEFT LOWER EXTREMITY Common Femoral Vein: No evidence of thrombus. Normal compressibility, respiratory phasicity and response to augmentation. Saphenofemoral Junction: No evidence of thrombus. Normal compressibility and flow on color Doppler imaging. Profunda Femoral Vein: No evidence of thrombus. Normal compressibility and flow on color Doppler imaging. Femoral Vein: No evidence of thrombus. Normal compressibility, respiratory phasicity and response to augmentation. Popliteal Vein: No evidence of thrombus. Normal compressibility, respiratory phasicity and response to augmentation. Calf Veins: No evidence of thrombus. Normal compressibility and flow on color Doppler imaging. Superficial Great Saphenous Vein: No evidence of thrombus. Normal compressibility. Venous Reflux:  None. Other Findings:  None. IMPRESSION: No convincing evidence of deep venous thrombosis in either lower extremity. There may be some minimal nonocclusive chronic thrombus within the peroneal veins in the right calf. Electronically Signed   By:  Wilkie Lent M.D.   On: 08/30/2024 10:54   DG HIP UNILAT WITH PELVIS 2-3 VIEWS LEFT Result Date: 08/28/2024 CLINICAL DATA:  Elective surgery. EXAM: DG HIP (WITH OR WITHOUT PELVIS) 2-3V LEFT COMPARISON:  Radiograph earlier today FINDINGS: Six fluoroscopic spot views of the left hip submitted from the operating room. Femoral intramedullary nail with trans trochanteric and distal locking screw fixation traverse proximal femur fracture. Fluoroscopy time 1 minutes 34 seconds. Dose 27.78 mGy. IMPRESSION: Intraoperative fluoroscopy during left proximal femur fracture ORIF. Electronically Signed   By: Andrea Gasman M.D.   On: 08/28/2024 17:06  DG C-Arm 1-60 Min-No Report Result Date: 08/28/2024 Fluoroscopy was utilized by the requesting physician.  No radiographic interpretation.   DG Chest 1 View Result Date: 08/28/2024 EXAM: 1 VIEW(S) XRAY OF THE CHEST 08/28/2024 11:13:00 AM COMPARISON: CTA chest 07/02/2024 and earlier. CLINICAL HISTORY: 78 year old female. Preop. Fall today. Hx of HTN. FINDINGS: LUNGS AND PLEURA: Left lung volume and ventilation appears substantially improved from the abnormal CTA in August. No confluent lung opacity now. No pulmonary edema. No pleural effusion. No pneumothorax. HEART AND MEDIASTINUM: No acute abnormality of the cardiac and mediastinal silhouettes. BONES AND SOFT TISSUES: No acute osseous abnormality. IMPRESSION: 1. No acute cardiopulmonary abnormality. Electronically signed by: Helayne Hurst MD 08/28/2024 11:36 AM EDT RP Workstation: [id number]   DG HIP UNILAT WITH PELVIS 2-3 VIEWS LEFT Result Date: 08/28/2024 EXAM: 2 or 3 VIEW(S) XRAY OF THE PELVIS AND LEFT HIP 08/28/2024 11:13:00 AM COMPARISON: CT abdomen and pelvis 07/19/2024. CLINICAL HISTORY: 78 year old female. Fall today at home, left hip pain. FINDINGS: JOINTS: SI joints are symmetric. Right hip demonstrates normal alignment. Left hip: Comminuted left femur intertrochanteric fracture varus impaction. Left  femoral head appears to remain normally located. SOFT TISSUES: Negative lower abdominal and pelvic visceral contours. IMPRESSION: 1. Comminuted left intertrochanteric femur fracture with varus impaction. . Electronically signed by: Helayne Hurst MD 08/28/2024 11:35 AM EDT RP Workstation: [id number]   CT HEAD WO CONTRAST ( ) Result Date: 08/28/2024 CLINICAL DATA:  Status post fall. EXAM: CT HEAD WITHOUT CONTRAST TECHNIQUE: Contiguous axial images were obtained from the base of the skull through the vertex without intravenous contrast. RADIATION DOSE REDUCTION: This exam was performed according to the departmental dose-optimization program which includes automated exposure control, adjustment of the mA and/or kV according to patient size and/or use of iterative reconstruction technique. COMPARISON:  October 29, 2023 FINDINGS: Brain: There is generalized cerebral atrophy with widening of the extra-axial spaces and ventricular dilatation. There are areas of decreased attenuation within the white matter tracts of the supratentorial brain, consistent with microvascular disease changes. Vascular: No hyperdense vessel or unexpected calcification. Skull: Normal. Negative for fracture or focal lesion. Sinuses/Orbits: No acute finding. Other: None. IMPRESSION: 1. No acute intracranial abnormality. 2. 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 Electronically Signed   By: Suzen Dials M.D.   On: 08/28/2024 11:10         Jeydan Barner, DO [facility] 08/30/2024, 5:03 PM    Dictation software may have been used to generate the above note. Typos may occur and escape review in typed/dictated notes. Please contact Dr Marsa directly for clarity if needed.  Staff may message me via secure chat in Epic  but this may not receive an immediate response,  please page me for urgent matters!  If 7PM-7AM, please contact night  coverage [email or web address]

## 2024-08-30 NOTE — TOC Initial Note (Signed)
 Transition of Care [facility]) - Initial/Assessment Note    Patient Details  Name: [name] MRN: [id number] Date of Birth: January 18, 1946  [facility]) CM/SW Contact:    Alvaro Louder, LCSW Phone Number: 08/30/2024, 9:05 AM  Clinical Narrative:       Per Chart review patient from home PCP is Oneil Pinal LCSWA Faxed out information to SNF's in [place]. LCSWA will present Facilities to patient at the bedside.           TOC to follow for discharge        Patient Goals and CMS Choice            Expected Discharge Plan and Services                                              Prior Living Arrangements/Services                       Activities of Daily Living   ADL Screening (condition at time of admission) Independently performs ADLs?: No Does the patient have a NEW difficulty with bathing/dressing/toileting/self-feeding that is expected to last >3 days?: Yes (Initiates electronic notice to provider for possible OT consult) Does the patient have a NEW difficulty with getting in/out of bed, walking, or climbing stairs that is expected to last >3 days?: Yes (Initiates electronic notice to provider for possible PT consult) Does the patient have a NEW difficulty with communication that is expected to last >3 days?: No Is the patient deaf or have difficulty hearing?: No Does the patient have difficulty seeing, even when wearing glasses/contacts?: Yes Does the patient have difficulty concentrating, remembering, or making decisions?: No  Permission Sought/Granted                  Emotional Assessment              Admission diagnosis:  Closed fracture of left hip, initial encounter (HCC) [S72.002A] Closed comminuted intertrochanteric fracture of left femur (HCC) [S72.142A] Patient Active Problem List   Diagnosis Date Noted   Closed comminuted intertrochanteric fracture of femur, initial encounter (HCC) 08/28/2024   DVT of deep  femoral vein, right (HCC) 07/04/2024   Acute pulmonary embolism with acute cor pulmonale (HCC) 07/03/2024   Pleural effusion 07/03/2024   Chronic systolic CHF (congestive heart failure) (HCC) 07/02/2024   Nonischemic cardiomyopathy (HCC) 07/02/2024   CAP (community acquired pneumonia) 07/01/2024   Plaque psoriasis 09/28/2018   Acquired hypothyroidism 01/15/2017   Benign essential hypertension 10/13/2016   PCP:  Pinal Oneil FALCON, MD Pharmacy:   [facility] [phone] [address], [place] - [address] AT [facility] OF [address] [place] [address] [place] [address] Phone: [phone] Fax: [phone]  [facility] [address] [place] [address] Phone: [phone] Fax: [phone]     Social Drivers of Health (SDOH) Social History: SDOH Screenings   Food Insecurity: No Food Insecurity (08/28/2024)  Housing: Low Risk  (08/28/2024)  Recent Concern: Housing - High Risk (08/21/2024)   Received from [facility]  Transportation Needs: No Transportation Needs (08/28/2024)  Recent Concern: Transportation Needs - Unmet Transportation Needs (08/21/2024)   Received from [facility]  Utilities: Not At Risk (08/28/2024)  Financial Resource Strain: Low  Risk  (08/21/2024)   Received from [facility]  Social Connections: Moderately Isolated (08/28/2024)  Tobacco Use: High Risk (08/28/2024)   SDOH Interventions:     Readmission Risk Interventions     No data to display

## 2024-08-30 NOTE — Plan of Care (Signed)

## 2024-08-31 DIAGNOSIS — S72143A Displaced intertrochanteric fracture of unspecified femur, initial encounter for closed fracture: Secondary | ICD-10-CM | POA: Diagnosis not present

## 2024-08-31 LAB — BASIC METABOLIC PANEL WITH GFR
Anion gap: 8 (ref 5–15)
BUN: 32 mg/dL — ABNORMAL HIGH (ref 8–23)
CO2: 26 mmol/L (ref 22–32)
Calcium: 8.4 mg/dL — ABNORMAL LOW (ref 8.9–10.3)
Chloride: 105 mmol/L (ref 98–111)
Creatinine, Ser: 0.88 mg/dL (ref 0.44–1.00)
GFR, Estimated: >60 mL/min (ref >60)
Glucose, Bld: 103 mg/dL — ABNORMAL HIGH (ref 70–99)
Potassium: 4.1 mmol/L (ref 3.5–5.1)
Sodium: 139 mmol/L (ref 135–145)

## 2024-08-31 LAB — CBC
HCT: 32.4 % — ABNORMAL LOW (ref 36.0–46.0)
Hemoglobin: 10.6 g/dL — ABNORMAL LOW (ref 12.0–15.0)
MCH: 29.3 pg (ref 26.0–34.0)
MCHC: 32.7 g/dL (ref 30.0–36.0)
MCV: 89.5 fL (ref 80.0–100.0)
Platelets: 167 K/uL (ref 150–400)
RBC: 3.62 MIL/uL — ABNORMAL LOW (ref 3.87–5.11)
RDW: 13.1 % (ref 11.5–15.5)
WBC: 8.5 K/uL (ref 4.0–10.5)
nRBC: 0 % (ref 0.0–0.2)

## 2024-08-31 MED ORDER — TRAMADOL HCL 50 MG PO TABS: 50.0000 mg | ORAL_TABLET | Freq: Three times a day (TID) | ORAL | 0 refills | Status: AC | PRN

## 2024-08-31 MED ORDER — MENTHOL 3 MG MT LOZG: 1.0000 | LOZENGE | OROMUCOSAL | Status: AC | PRN

## 2024-08-31 MED ORDER — ACETAMINOPHEN 325 MG PO TABS: 325.0000 mg | ORAL_TABLET | Freq: Four times a day (QID) | ORAL | Status: AC | PRN

## 2024-08-31 MED ORDER — POLYETHYLENE GLYCOL 3350 17 G PO PACK: 17.0000 g | PACK | Freq: Two times a day (BID) | ORAL | Status: AC

## 2024-08-31 MED ORDER — MELATONIN 5 MG PO TABS
5.0000 mg | ORAL_TABLET | Freq: Every day | ORAL | Status: AC
Start: 2024-08-31 — End: ?

## 2024-08-31 MED ORDER — TRAZODONE HCL 50 MG PO TABS: 50.0000 mg | ORAL_TABLET | Freq: Every day | ORAL | Status: AC

## 2024-08-31 MED ORDER — HYDROCODONE-ACETAMINOPHEN 7.5-325 MG PO TABS: 1.0000 | ORAL_TABLET | Freq: Four times a day (QID) | ORAL | 0 refills | Status: AC | PRN

## 2024-08-31 NOTE — TOC Transition Note (Signed)
 Transition of Care [facility]) - Discharge Note   Patient Details  Name: [name] MRN: [id number] Date of Birth: December 04, 1945  [facility] CM/SW Contact:  Alvaro Louder, LCSW Phone Number: 08/31/2024, 1:13 PM   Clinical Narrative:     LCSWA received insurance approval for patient to admit to SNF [facility]. LCSWA confirmed with MD that patient is stable for discharge. LCSWA notified the patient and they are in agreement with discharge. LCSWA confirmed bed is available at SNF. Transport arranged with [facility] for next available.  RM [phone] Number to call report: [phone]   [facility] signing off       Patient Goals and CMS Choice            Discharge Placement                       Discharge Plan and Services Additional resources added to the After Visit Summary for                                       Social Drivers of Health (SDOH) Interventions SDOH Screenings   Food Insecurity: No Food Insecurity (08/28/2024)  Housing: Low Risk  (08/28/2024)  Recent Concern: Housing - High Risk (08/21/2024)   Received from [facility]  Transportation Needs: No Transportation Needs (08/28/2024)  Recent Concern: Transportation Needs - Unmet Transportation Needs (08/21/2024)   Received [facility]  Utilities: Not At Risk (08/28/2024)  Financial Resource Strain: Low Risk  (08/21/2024)   Received from [facility]  Social Connections: Moderately Isolated (08/28/2024)  Tobacco Use: High Risk (08/28/2024)     Readmission Risk Interventions     No data to display

## 2024-08-31 NOTE — Progress Notes (Signed)
 Report given to  Evan Mckneil (LPN) , [facility] on [phone] . Handed over patient belongings. Awaiting transport

## 2024-08-31 NOTE — Progress Notes (Signed)
 Patient Last BM was 08/28/2024.Primary nurse offered prescribed laxative, Miralax . Patient refused and stated I don't want to take it'. Patient was educated on the importance of the medication in preventing constipation and associated complications.

## 2024-08-31 NOTE — Progress Notes (Signed)
 Occupational Therapy Treatment Patient Details Name: [name] MRN: [id number] DOB: Mar 23, 1946 Today's Date: 08/31/2024   History of present illness Pt is a 78 year old female s/p L IMN 08/29/24 after presenting to the ED with a fall     PMH significant for PE in 06/2024 on eliquis , hypothyroidism, MDD, GAD   OT comments  Patient seen for OT treatment on this date. Upon arrival to room patient sitting in recliner on 2L of O2, O2 98% at rest. Patient agreeable to treatment. Reports she is considering SNF and has spoken with TOC. Patient performed sit<>stand from recliner with r/w with SPV, ambulated to sink and engaged in 10 minutes of continuous activity while in stance with UE support and SBA (grooming tasks) performed without O2 in place and O2 monitored throughout, never went below 95%, notified RN who was agreeable to notify MD. Patient denied increase pain in LLE while in stance.  Patient ended treatment in recliner with chair alarm on and all needs within reach. Patient making good progress toward goals, will continue to follow POC. Discharge recommendation remains appropriate.         If plan is discharge home, recommend the following:  A little help with walking and/or transfers;A little help with bathing/dressing/bathroom;Assistance with cooking/housework   Equipment Recommendations  Other (comment) (defer to next venue of care)    Recommendations for Other Services      Precautions / Restrictions Precautions Precautions: Fall Recall of Precautions/Restrictions: Intact Restrictions Weight Bearing Restrictions Per Provider Order: Yes LLE Weight Bearing Per Provider Order: Weight bearing as tolerated       Mobility Bed Mobility                    Transfers Overall transfer level: Needs assistance Equipment used: Rolling walker (2 wheels) Transfers: Sit to/from Stand Sit to Stand: Supervision                 Balance Overall balance assessment: Needs  assistance         Standing balance support: Bilateral upper extremity supported, During functional activity Standing balance-Leahy Scale: Fair                             ADL either performed or assessed with clinical judgement   ADL Overall ADL's : Needs assistance/impaired     Grooming: Wash/dry hands;Wash/dry face;Oral care;Brushing hair;Supervision/safety;Set up;Standing                                      Extremity/Trunk Assessment              Vision       Perception     Praxis     Communication Communication Communication: No apparent difficulties   Cognition Arousal: Alert Behavior During Therapy: WFL for tasks assessed/performed, Anxious Cognition: No apparent impairments                               Following commands: Intact        Cueing   Cueing Techniques: Verbal cues, Tactile cues  Exercises      Shoulder Instructions       General Comments      Pertinent Vitals/ Pain       Pain Assessment Pain Assessment: 0-10 Pain Score: 5  Pain Location: L hip Pain Descriptors / Indicators: Discomfort, Sore Pain Intervention(s): Monitored during session  Home Living                                          Prior Functioning/Environment              Frequency  Min 3X/week        Progress Toward Goals  OT Goals(current goals can now be found in the care plan section)  Progress towards OT goals: Progressing toward goals  Acute Rehab OT Goals Patient Stated Goal: to be safe OT Goal Formulation: With patient Time For Goal Achievement: 09/12/24 Potential to Achieve Goals: Good ADL Goals Pt Will Perform Grooming: with modified independence;sitting;standing Pt Will Perform Lower Body Dressing: with modified independence;sitting/lateral leans;sit to/from stand Pt Will Transfer to Toilet: with modified independence;ambulating Pt Will Perform Toileting - Clothing  Manipulation and hygiene: with modified independence;sitting/lateral leans;sit to/from stand  Plan      Co-evaluation                 AM-PAC OT 6 Clicks Daily Activity     Outcome Measure   Help from another person eating meals?: None Help from another person taking care of personal grooming?: None Help from another person toileting, which includes using toliet, bedpan, or urinal?: A Little Help from another person bathing (including washing, rinsing, drying)?: A Little Help from another person to put on and taking off regular upper body clothing?: A Little Help from another person to put on and taking off regular lower body clothing?: A Little 6 Click Score: 20    End of Session Equipment Utilized During Treatment: Rolling walker (2 wheels);Gait belt  OT Visit Diagnosis: Other abnormalities of gait and mobility (R26.89);Muscle weakness (generalized) (M62.81)   Activity Tolerance Patient tolerated treatment well   Patient Left in chair;with call bell/phone within reach;with chair alarm set   Nurse Communication Other (comment) (O2 above 95% throughout session while on RA)        Time: 9047-8982 OT Time Calculation (min): 25 min  Charges: OT General Charges $OT Visit: 1 Visit OT Treatments $Self Care/Home Management : 23-37 mins  Rogers Clause, OT/L [facility], 08/31/2024

## 2024-08-31 NOTE — Discharge Summary (Signed)
 Physician Discharge Summary   Patient: [name] MRN: [id number]  DOB: 01-29-1946   Admit:     Date of Admission: 08/28/2024 Admitted from: home   Discharge: Date of discharge: 08/31/24 Disposition: Skilled nursing facility Condition at discharge: good  CODE STATUS: FULL CODE     Discharge Physician: Laneta Blunt, DO [facility]     PCP: Cleotilde Oneil FALCON, MD  Recommendations for Outpatient Follow-up:  Follow up with PCP Cleotilde Oneil FALCON, MD in 2-4 weeks Follow up with [facility] in 2 weeks     Discharge Instructions     Ambulatory Referral for Lung Cancer Scre   Complete by: As directed    Diet - low sodium heart healthy   Complete by: As directed    Discharge wound care:   Complete by: As directed    Reinforce / change dressing daily or as needed if soiled.   Increase activity slowly   Complete by: As directed          Discharge Diagnoses: Principal Problem:   Closed comminuted intertrochanteric fracture of femur, initial encounter (HCC) Active Problems:   Acquired hypothyroidism   Benign essential hypertension   Chronic systolic CHF (congestive heart failure) (HCC)   Nonischemic cardiomyopathy [facility])       Hospital course / significant events:   HPI: [name] is a 78 y.o. female with a PMH significant for PE in 06/2024 on eliquis , hypothyroidism, MDD, GAD presenting to the ED after a fall. Pt states felt dizzy and started sweating in her head and was to walk to get to her husband and fell prior to reaching him. Pt states she was not eating well for a couple of days prior as she wasn't hungry.    10/13: admitted w/ left intertrochanteric femur fracture. Lab work showed an AKI along with hypokalemia and CBC with mild leukocytosis. To OR today for L hip intramedullary nail repair.  10/14: PT/OT rec for SNF rehab  10/15: auth for rehab ok to go tomorrow. Of note concern for LE edema, no DVT on US , quetsion min nonocclusive thrombus R  peroneal V 10/16: doing well, clear for dc to SNF rehab     Consultants:  Orthopedics surgery   Procedures/Surgeries: 10/13: L hip intramedullary nail repair.       ASSESSMENT & PLAN:   Right intertrochanteric femur fracture s/p L hip intramedullary nail repair.10/13 continue PT/OT analgesia PRN.  ortho following Resume Eliquis   Follow up with [facility] in 2 weeks    AKI- resolved  Baseline around 0.8. BMP outpatient in 1-2 weeks    Chronic conditions stable: Acquired hypothyroidism: continue home synthroid .  HTN: held home beta blocker, hydrochlorothiazide - BP has been at goal  Hx HFpEF last echo 06/2024 EF 55-60 grde 1 diast df: held home beta blocker d/t low BP, follow outpatient to resume or continue holding  Hx of DVT/PE: restarted eliquis      Overweight / Class 1 obesity based on BMI: Body mass index is 29.86 kg/m.SABRA Significantly low or high BMI is associated with higher medical risk.  Underweight - under 18  overweight - 25 to 29 obese - 30 or more Class 1 obesity: BMI of 30.0 to 34 Class 2 obesity: BMI of 35.0 to 39 Class 3 obesity: BMI of 40.0 to 49 Super Morbid Obesity: BMI 50-59 Super-super Morbid Obesity: BMI 60+ Healthy nutrition and physical activity advised as adjunct to other disease management and risk reduction treatments  Discharge Instructions  Allergies as of 08/31/2024       Reactions   Ciprofloxacin Other (See Comments)   Caused cdiff.  C-Diff        Medication List     PAUSE taking these medications    metoprolol  succinate 25 MG 24 hr tablet Wait to take this until your doctor or other care provider tells you to start again. Commonly known as: TOPROL -XL TAKE 1 TABLET(25 MG) BY MOUTH DAILY       STOP taking these medications    hydrochlorothiazide 25 MG tablet Commonly known as: HYDRODIURIL   hydrOXYzine  10 MG tablet Commonly known as: ATARAX    temazepam 15 MG capsule Commonly known as:  RESTORIL       TAKE these medications    acetaminophen  325 MG tablet Commonly known as: TYLENOL  Take 1-2 tablets (325-650 mg total) by mouth every 6 (six) hours as needed for mild pain (pain score 1-3) or fever (or temp > 100.5).   atorvastatin  10 MG tablet Commonly known as: LIPITOR Take 10 mg by mouth daily.   cyanocobalamin 1000 MCG/ML injection Commonly known as: VITAMIN B12 Inject 100 mcg into the skin every 30 (thirty) days.   Eliquis  5 MG Tabs tablet Generic drug: apixaban  Take 1 tablet (5 mg total) by mouth 2 (two) times daily.   escitalopram  10 MG tablet Commonly known as: LEXAPRO  Take 10 mg by mouth daily.   folic acid 1 MG tablet Commonly known as: FOLVITE Take 1 mg by mouth daily.   HYDROcodone-acetaminophen  7.5-325 MG tablet Commonly known as: NORCO Take 1 tablet by mouth every 6 (six) hours as needed (breakthrough pain not controlled by tramadol ).   levothyroxine  150 MCG tablet Commonly known as: SYNTHROID  Take by mouth.   liothyronine  25 MCG tablet Commonly known as: CYTOMEL  Take 25 mcg by mouth daily.   loratadine 10 MG tablet Commonly known as: CLARITIN Take 10 mg by mouth daily as needed.   melatonin 5 MG Tabs Take 1 tablet (5 mg total) by mouth at bedtime.   menthol 3 MG lozenge Commonly known as: CEPACOL Take 1 lozenge (3 mg total) by mouth as needed for sore throat.   polyethylene glycol 17 g packet Commonly known as: MIRALAX  / GLYCOLAX  Take 17 g by mouth 2 (two) times daily.   traMADol  50 MG tablet Commonly known as: ULTRAM  Take 1 tablet (50 mg total) by mouth every 8 (eight) hours as needed for moderate pain (pain score 4-6) or severe pain (pain score 7-10). What changed:  when to take this reasons to take this   traZODone 50 MG tablet Commonly known as: DESYREL Take 1-2 tablets (50-100 mg total) by mouth at bedtime. If 50 mg ineffective for insomnia, can increase to 100 mg What changed:  how much to take additional  instructions   Vitron-C 65-125 MG Tabs Generic drug: Iron-Vitamin C Take 1 tablet by mouth daily.               Discharge Care Instructions  (From admission, onward)           Start     Ordered   08/31/24 0000  Discharge wound care:       Comments: Reinforce / change dressing daily or as needed if soiled.   08/31/24 1131             Follow-up Information     Charlene Debby BROCKS, PA-C Follow up in 2 week(s).   Specialties: Orthopedic Surgery, Emergency Medicine Contact  information: [address] [place] [address] [phone]                 Allergies  Allergen Reactions   Ciprofloxacin Other (See Comments)    Caused cdiff.   C-Diff     Subjective: pt feeling well this morning, no concerns, pain controlled, tolerating diet, voiding, no CP/SOB   Discharge Exam: BP 124/65 (BP Location: Left Arm)   Pulse 79   Temp 97.9 F (36.6 C)   Resp 17   Ht 5' 6 (1.676 m)   Wt 83.9 kg   SpO2 96%   BMI 29.86 kg/m  General: Pt is alert, awake, not in acute distress Cardiovascular: RRR, S1/S2 +, no rubs, no gallops Respiratory: CTA bilaterally, no wheezing, no rhonchi Abdominal: Soft, NT, ND, bowel sounds + Extremities: no edema, no cyanosis     The results of significant diagnostics from this hospitalization (including imaging, microbiology, ancillary and laboratory) are listed below for reference.     Microbiology: No results found for this or any previous visit (from the past 240 hours).   Labs: BNP (last 3 results) No results for input(s): BNP in the last 8760 hours. Basic Metabolic Panel: Recent Labs  Lab 08/28/24 1045 08/28/24 1454 08/29/24 0541 08/30/24 0319 08/31/24 0353  NA 141  --  140 140 139  K 3.1*  --  5.1 4.1 4.1  CL 100  --  106 108 105  CO2 23  --  26 25 26   GLUCOSE 131*  --  151* 120* 103*  BUN 34*  --  34* 39* 32*  CREATININE 1.39*  --  1.25* 1.20* 0.88  CALCIUM  9.1  --  8.9 8.4* 8.4*  MG  --  2.4  --    --   --    Liver Function Tests: No results for input(s): AST, ALT, ALKPHOS, BILITOT, PROT, ALBUMIN in the last 168 hours. No results for input(s): LIPASE, AMYLASE in the last 168 hours. No results for input(s): AMMONIA in the last 168 hours. CBC: Recent Labs  Lab 08/28/24 1045 08/29/24 0541 08/30/24 0319 08/31/24 0353  WBC 13.6* 13.3* 12.2* 8.5  HGB 14.7 13.2 11.0* 10.6*  HCT 45.7 39.8 32.9* 32.4*  MCV 89.3 87.7 88.2 89.5  PLT 264 226 189 167   Cardiac Enzymes: No results for input(s): CKTOTAL, CKMB, CKMBINDEX, TROPONINI in the last 168 hours. BNP: Invalid input(s): POCBNP CBG: No results for input(s): GLUCAP in the last 168 hours. D-Dimer No results for input(s): DDIMER in the last 72 hours. Hgb A1c No results for input(s): HGBA1C in the last 72 hours. Lipid Profile No results for input(s): CHOL, HDL, LDLCALC, TRIG, CHOLHDL, LDLDIRECT in the last 72 hours. Thyroid  function studies No results for input(s): TSH, T4TOTAL, T3FREE, THYROIDAB in the last 72 hours.  Invalid input(s): FREET3 Anemia work up No results for input(s): VITAMINB12, FOLATE, FERRITIN, TIBC, IRON, RETICCTPCT in the last 72 hours. Urinalysis    Component Value Date/Time   COLORURINE Yellow 07/16/2013 1121   APPEARANCEUR Hazy 07/16/2013 1121   LABSPEC 1.025 07/16/2013 1121   PHURINE 5.0 07/16/2013 1121   GLUCOSEU Negative 07/16/2013 1121   HGBUR Negative 07/16/2013 1121   BILIRUBINUR Negative 07/16/2013 1121   KETONESUR 2+ 07/16/2013 1121   PROTEINUR 30 mg/dL 91/68/7985 8878   NITRITE Negative 07/16/2013 1121   LEUKOCYTESUR Trace 07/16/2013 1121   Sepsis Labs Recent Labs  Lab 08/28/24 1045 08/29/24 0541 08/30/24 0319 08/31/24 0353  WBC 13.6* 13.3* 12.2* 8.5  Microbiology No results found for this or any previous visit (from the past 240 hours). Imaging DG HIP UNILAT WITH PELVIS 2-3 VIEWS LEFT Result Date:  08/28/2024 CLINICAL DATA:  Elective surgery. EXAM: DG HIP (WITH OR WITHOUT PELVIS) 2-3V LEFT COMPARISON:  Radiograph earlier today FINDINGS: Six fluoroscopic spot views of the left hip submitted from the operating room. Femoral intramedullary nail with trans trochanteric and distal locking screw fixation traverse proximal femur fracture. Fluoroscopy time 1 minutes 34 seconds. Dose 27.78 mGy. IMPRESSION: Intraoperative fluoroscopy during left proximal femur fracture ORIF. Electronically Signed   By: Andrea Gasman M.D.   On: 08/28/2024 17:06   DG C-Arm 1-60 Min-No Report Result Date: 08/28/2024 Fluoroscopy was utilized by the requesting physician.  No radiographic interpretation.   DG Chest 1 View Result Date: 08/28/2024 EXAM: 1 VIEW(S) XRAY OF THE CHEST 08/28/2024 11:13:00 AM COMPARISON: CTA chest 07/02/2024 and earlier. CLINICAL HISTORY: 78 year old female. Preop. Fall today. Hx of HTN. FINDINGS: LUNGS AND PLEURA: Left lung volume and ventilation appears substantially improved from the abnormal CTA in August. No confluent lung opacity now. No pulmonary edema. No pleural effusion. No pneumothorax. HEART AND MEDIASTINUM: No acute abnormality of the cardiac and mediastinal silhouettes. BONES AND SOFT TISSUES: No acute osseous abnormality. IMPRESSION: 1. No acute cardiopulmonary abnormality. Electronically signed by: Helayne Hurst MD 08/28/2024 11:36 AM EDT RP Workstation: [id number]   DG HIP UNILAT WITH PELVIS 2-3 VIEWS LEFT Result Date: 08/28/2024 EXAM: 2 or 3 VIEW(S) XRAY OF THE PELVIS AND LEFT HIP 08/28/2024 11:13:00 AM COMPARISON: CT abdomen and pelvis 07/19/2024. CLINICAL HISTORY: 78 year old female. Fall today at home, left hip pain. FINDINGS: JOINTS: SI joints are symmetric. Right hip demonstrates normal alignment. Left hip: Comminuted left femur intertrochanteric fracture varus impaction. Left femoral head appears to remain normally located. SOFT TISSUES: Negative lower abdominal and pelvic  visceral contours. IMPRESSION: 1. Comminuted left intertrochanteric femur fracture with varus impaction. . Electronically signed by: Helayne Hurst MD 08/28/2024 11:35 AM EDT RP Workstation: [id number]   CT HEAD WO CONTRAST ( ) Result Date: 08/28/2024 CLINICAL DATA:  Status post fall. EXAM: CT HEAD WITHOUT CONTRAST TECHNIQUE: Contiguous axial images were obtained from the base of the skull through the vertex without intravenous contrast. RADIATION DOSE REDUCTION: This exam was performed according to the departmental dose-optimization program which includes automated exposure control, adjustment of the mA and/or kV according to patient size and/or use of iterative reconstruction technique. COMPARISON:  October 29, 2023 FINDINGS: Brain: There is generalized cerebral atrophy with widening of the extra-axial spaces and ventricular dilatation. There are areas of decreased attenuation within the white matter tracts of the supratentorial brain, consistent with microvascular disease changes. Vascular: No hyperdense vessel or unexpected calcification. Skull: Normal. Negative for fracture or focal lesion. Sinuses/Orbits: No acute finding. Other: None. IMPRESSION: 1. No acute intracranial abnormality. 2. 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 Electronically Signed   By: Suzen Dials M.D.   On: 08/28/2024 11:10      Time coordinating discharge: ovr 30 minutes  SIGNED:  Margaux Engen DO [facility]

## 2024-08-31 NOTE — Progress Notes (Signed)
   Subjective: 3 Days Post-Op Procedure(s) (LRB): FIXATION, FRACTURE, INTERTROCHANTERIC, WITH INTRAMEDULLARY ROD (Left) Patient reports pain as mild.   Patient is well, and has had no acute complaints or problems Denies any CP, SOB, ABD pain. We will continue therapy today.    Objective: Vital signs in last 24 hours: Temp:  [97.8 F (36.6 C)-98.3 F (36.8 C)] 97.9 F (36.6 C) (10/16 0812) Pulse Rate:  [77-91] 79 (10/16 0812) Resp:  [17-18] 17 (10/16 0812) BP: (117-124)/(60-65) 124/65 (10/16 0812) SpO2:  [91 %-98 %] 96 % (10/16 0812)  Intake/Output from previous day: 10/15 0701 - 10/16 0700 In: 700 [P.O.:700] Out: -  Intake/Output this shift: No intake/output data recorded.  Recent Labs    08/28/24 1045 08/29/24 0541 08/30/24 0319 08/31/24 0353  HGB 14.7 13.2 11.0* 10.6*   Recent Labs    08/30/24 0319 08/31/24 0353  WBC 12.2* 8.5  RBC 3.73* 3.62*  HCT 32.9* 32.4*  PLT 189 167   Recent Labs    08/30/24 0319 08/31/24 0353  NA 140 139  K 4.1 4.1  CL 108 105  CO2 25 26  BUN 39* 32*  CREATININE 1.20* 0.88  GLUCOSE 120* 103*  CALCIUM  8.4* 8.4*   Recent Labs    08/28/24 1454  INR 1.0    EXAM General - Patient is Alert, Appropriate, and Oriented Extremity - Neurovascular intact Sensation intact distally Intact pulses distally Dorsiflexion/Plantar flexion intact No cellulitis present Compartment soft Dressing - dressing C/D/I and no drainage Motor Function - intact, moving foot and toes well on exam.   Past Medical History:  Diagnosis Date   Arthritis    C. difficile diarrhea    Hypertension    Thyroid  disease     Assessment/Plan:   3 Days Post-Op Procedure(s) (LRB): FIXATION, FRACTURE, INTERTROCHANTERIC, WITH INTRAMEDULLARY ROD (Left) Principal Problem:   Closed comminuted intertrochanteric fracture of femur, initial encounter (HCC) Active Problems:   Acquired hypothyroidism   Benign essential hypertension   Chronic systolic CHF  (congestive heart failure) (HCC)   Nonischemic cardiomyopathy (HCC)  Estimated body mass index is 29.86 kg/m as calculated from the following:   Height as of this encounter: 5' 6 (1.676 m).   Weight as of this encounter: 83.9 kg. Advance diet Up with therapy, slow progress with PT Pain well controlled Labs and VSS Resume Eliquis , hx of chronic DVT/PE CM to assist with discharge to SNF  Follow up with [facility] in 2 weeks  DVT Prophylaxis - TED hose and SCDS Eliquis  Weight-Bearing as tolerated to left leg   T. Medford Amber, PA-C [facility] 08/31/2024, 9:58 AM

## 2024-08-31 NOTE — Care Management Important Message (Signed)
 Important Message  Patient Details  Name: [name] MRN: [id number] Date of Birth: [birth year]   Important Message Given:  Yes - Medicare IM     Kyriana Yankee W, CMA 08/31/2024, 1:15 PM

## 2024-08-31 NOTE — Plan of Care (Signed)

## 2024-09-02 NOTE — Anesthesia Postprocedure Evaluation (Signed)
 Anesthesia Post Note  Patient: [name]  Procedure(s) Performed: FIXATION, FRACTURE, INTERTROCHANTERIC, WITH INTRAMEDULLARY ROD (Left: Hip)  Patient location during evaluation: PACU Anesthesia Type: General Level of consciousness: awake and alert Pain management: pain level controlled Vital Signs Assessment: post-procedure vital signs reviewed and stable Respiratory status: spontaneous breathing, nonlabored ventilation, respiratory function stable and patient connected to nasal cannula oxygen Cardiovascular status: blood pressure returned to baseline and stable Postop Assessment: no apparent nausea or vomiting Anesthetic complications: no   There were no known notable events for this encounter.   Last Vitals:  Vitals:   08/31/24 0246 08/31/24 0812  BP: 120/62 124/65  Pulse: 77 79  Resp: 17 17  Temp: 36.6 C 36.6 C  SpO2: 97% 96%    Last Pain:  Vitals:   08/31/24 0810  TempSrc:   PainSc: 0-No pain                 Prentice Murphy

## 2024-10-20 ENCOUNTER — Other Ambulatory Visit (HOSPITAL_COMMUNITY): Payer: Self-pay

## 2025-03-23 ENCOUNTER — Ambulatory Visit: Admitting: Cardiovascular Disease

## 2025-08-14 ENCOUNTER — Ambulatory Visit (INDEPENDENT_AMBULATORY_CARE_PROVIDER_SITE_OTHER): Admitting: Vascular Surgery
